# Patient Record
Sex: Male | Born: 1980 | Race: Black or African American | Hispanic: No | Marital: Married | Smoking: Current every day smoker
Health system: Southern US, Community
[De-identification: ages and names within clinical notes are randomized; demographics above are authoritative.]

## PROBLEM LIST (undated history)

## (undated) DIAGNOSIS — R51 Headache: Secondary | ICD-10-CM

---

## 2005-01-10 ENCOUNTER — Emergency Department (HOSPITAL_COMMUNITY): Admission: EM | Admit: 2005-01-10 | Discharge: 2005-01-10 | Payer: Self-pay | Admitting: Emergency Medicine

## 2005-01-14 ENCOUNTER — Emergency Department (HOSPITAL_COMMUNITY): Admission: EM | Admit: 2005-01-14 | Discharge: 2005-01-14 | Payer: Self-pay | Admitting: Emergency Medicine

## 2009-04-30 ENCOUNTER — Emergency Department (HOSPITAL_COMMUNITY): Admission: EM | Admit: 2009-04-30 | Discharge: 2009-04-30 | Payer: Self-pay | Admitting: Emergency Medicine

## 2010-02-02 ENCOUNTER — Emergency Department (HOSPITAL_COMMUNITY)
Admission: EM | Admit: 2010-02-02 | Discharge: 2010-02-02 | Payer: Self-pay | Source: Home / Self Care | Admitting: Emergency Medicine

## 2010-07-02 ENCOUNTER — Emergency Department (HOSPITAL_COMMUNITY)
Admission: EM | Admit: 2010-07-02 | Discharge: 2010-07-02 | Disposition: A | Discharge status: Home / Self Care | Payer: Self-pay | Attending: Emergency Medicine | Admitting: Emergency Medicine

## 2010-07-02 DIAGNOSIS — R112 Nausea with vomiting, unspecified: Secondary | ICD-10-CM | POA: Insufficient documentation

## 2010-07-02 DIAGNOSIS — R197 Diarrhea, unspecified: Secondary | ICD-10-CM | POA: Insufficient documentation

## 2010-07-02 DIAGNOSIS — E876 Hypokalemia: Secondary | ICD-10-CM | POA: Insufficient documentation

## 2010-07-02 LAB — BASIC METABOLIC PANEL
CO2: 25 mEq/L (ref 19–32)
Chloride: 105 mEq/L (ref 96–112)
Creatinine, Ser: 1.01 mg/dL (ref 0.4–1.5)
GFR calc Af Amer: >60 mL/min (ref >60)
Glucose, Bld: 102 mg/dL — ABNORMAL HIGH (ref 70–99)
Potassium: 3.1 mEq/L — ABNORMAL LOW (ref 3.5–5.1)
Sodium: 136 mEq/L (ref 135–145)

## 2010-07-02 LAB — DIFFERENTIAL
Basophils Absolute: 0 10*3/uL (ref 0.0–0.1)
Basophils Relative: 0 % (ref 0–1)
Eosinophils Relative: 1 % (ref 0–5)
Lymphocytes Relative: 19 % (ref 12–46)
Lymphs Abs: 1.2 10*3/uL (ref 0.7–4.0)
Monocytes Absolute: 0.6 10*3/uL (ref 0.1–1.0)
Monocytes Relative: 9 % (ref 3–12)
Neutro Abs: 4.6 10*3/uL (ref 1.7–7.7)
Neutrophils Relative %: 72 % (ref 43–77)

## 2010-07-02 LAB — URINALYSIS, ROUTINE W REFLEX MICROSCOPIC
Bilirubin Urine: NEGATIVE
Glucose, UA: NEGATIVE mg/dL
Hgb urine dipstick: NEGATIVE
Protein, ur: 30 mg/dL — AB
Specific Gravity, Urine: >1.03 — ABNORMAL HIGH (ref 1.005–1.030)
Urobilinogen, UA: 0.2 mg/dL (ref 0.0–1.0)
pH: 5.5 (ref 5.0–8.0)

## 2010-07-02 LAB — URINE MICROSCOPIC-ADD ON

## 2010-07-02 LAB — CBC
HCT: 44.8 % (ref 39.0–52.0)
Hemoglobin: 15.2 g/dL (ref 13.0–17.0)
MCH: 30.5 pg (ref 26.0–34.0)
MCHC: 33.9 g/dL (ref 30.0–36.0)
Platelets: 225 10*3/uL (ref 150–400)
RBC: 4.99 MIL/uL (ref 4.22–5.81)
RDW: 13 % (ref 11.5–15.5)

## 2010-12-19 ENCOUNTER — Encounter: Payer: Self-pay | Admitting: *Deleted

## 2010-12-19 ENCOUNTER — Emergency Department (HOSPITAL_COMMUNITY)
Admission: EM | Admit: 2010-12-19 | Discharge: 2010-12-19 | Disposition: A | Discharge status: Home / Self Care | Payer: Self-pay | Attending: Emergency Medicine | Admitting: Emergency Medicine

## 2010-12-19 DIAGNOSIS — R509 Fever, unspecified: Secondary | ICD-10-CM

## 2010-12-19 DIAGNOSIS — D72829 Elevated white blood cell count, unspecified: Secondary | ICD-10-CM

## 2010-12-19 DIAGNOSIS — K5289 Other specified noninfective gastroenteritis and colitis: Secondary | ICD-10-CM

## 2010-12-19 LAB — BASIC METABOLIC PANEL
BUN: 17 mg/dL (ref 6–23)
CO2: 25 mEq/L (ref 19–32)
Calcium: 9.5 mg/dL (ref 8.4–10.5)
Chloride: 102 mEq/L (ref 96–112)
GFR calc Af Amer: >60 mL/min (ref >60)
GFR calc non Af Amer: >60 mL/min (ref >60)
Potassium: 3.8 mEq/L (ref 3.5–5.1)
Sodium: 137 mEq/L (ref 135–145)

## 2010-12-19 LAB — CBC
HCT: 41.6 % (ref 39.0–52.0)
Hemoglobin: 14 g/dL (ref 13.0–17.0)
MCV: 89.3 fL (ref 78.0–100.0)
Platelets: 232 10*3/uL (ref 150–400)
RBC: 4.66 MIL/uL (ref 4.22–5.81)
RDW: 12.7 % (ref 11.5–15.5)
WBC: 14.4 10*3/uL — ABNORMAL HIGH (ref 4.0–10.5)

## 2010-12-19 LAB — DIFFERENTIAL
Basophils Absolute: 0 10*3/uL (ref 0.0–0.1)
Basophils Relative: 0 % (ref 0–1)
Eosinophils Absolute: 0 10*3/uL (ref 0.0–0.7)
Eosinophils Relative: 0 % (ref 0–5)
Lymphocytes Relative: 5 % — ABNORMAL LOW (ref 12–46)
Monocytes Relative: 5 % (ref 3–12)
Neutro Abs: 12.9 10*3/uL — ABNORMAL HIGH (ref 1.7–7.7)
Neutrophils Relative %: 90 % — ABNORMAL HIGH (ref 43–77)

## 2010-12-19 MED ORDER — ONDANSETRON HCL 4 MG/2ML IJ SOLN
4.0000 mg | Freq: Once | INTRAMUSCULAR | Status: AC
  Administered 2010-12-19: 4 mg via INTRAVENOUS
  Filled 2010-12-19: qty 2

## 2010-12-19 MED ORDER — PROMETHAZINE HCL 25 MG PO TABS: 25.0000 mg | ORAL_TABLET | Freq: Four times a day (QID) | ORAL | Status: AC | PRN

## 2010-12-19 MED ORDER — SODIUM CHLORIDE 0.9 % IV BOLUS (SEPSIS)
1000.0000 mL | Freq: Once | INTRAVENOUS | Status: AC
  Administered 2010-12-19: 1000 mL via INTRAVENOUS

## 2010-12-19 MED ORDER — ACETAMINOPHEN 325 MG PO TABS
650.0000 mg | ORAL_TABLET | Freq: Once | ORAL | Status: AC
  Administered 2010-12-19: 650 mg via ORAL
  Filled 2010-12-19: qty 2

## 2010-12-19 MED ORDER — LOPERAMIDE HCL 2 MG PO CAPS: 2.0000 mg | ORAL_CAPSULE | Freq: Four times a day (QID) | ORAL | Status: AC | PRN

## 2010-12-19 NOTE — ED Provider Notes (Addendum)
History    Antonio Pace is a 30 year old male with no past medical history he presents to the emergency department with a fever, nausea, vomiting, diarrhea, and abdominal pain since this morning. He denies blood in his stool or emesis. He took a BC powder and ibuprofen his temperature decreased to 103 to 101.  He denies abdominal pain now. He denies exposure to anyone with similar symptoms. He denies recent antibiotic use.    CSN: [id number] Arrival date & time: 12/19/2010 12:11 PM   Chief Complaint  Patient presents with  . Abdominal Pain     (Include location/radiation/quality/duration/timing/severity/associated sxs/prior treatment) The history is provided by the patient and the spouse.     History reviewed. No pertinent past medical history.   History reviewed. No pertinent past surgical history.  No family history on file.  History  Substance Use Topics  . Smoking status: Current Everyday Smoker  . Smokeless tobacco: Not on file  . Alcohol Use: No      Review of Systems  Constitutional: Positive for fever. Negative for diaphoresis.  HENT: Negative for neck pain.   Eyes: Negative for redness.  Respiratory: Negative for cough, chest tightness and shortness of breath.   Cardiovascular: Negative for chest pain and palpitations.  Gastrointestinal: Positive for nausea, vomiting, abdominal pain and diarrhea.       Abdominal pain, has resolved.  Musculoskeletal: Negative for back pain.  Neurological: Negative for headaches.  Psychiatric/Behavioral: Negative for confusion.    Allergies  Review of patient's allergies indicates not on file.  Home Medications   Current Outpatient Rx  Name Route Sig Dispense Refill  . ASPIRIN-CAFFEINE 845-65 MG PO PACK Oral Take 1 packet by mouth daily as needed. For pain     . BISMUTH SUBSALICYLATE 262 MG PO CHEW Oral Chew 524 mg by mouth as needed. Upset stomach     . IBUPROFEN 200 MG PO TABS Oral Take 200 mg by mouth every 6  (six) hours as needed. Fever, pain       Physical Exam    BP 105/64  Pulse 108  Temp(Src) 99.6 F (37.6 C) (Oral)  Resp 20  Ht 5\' 4"  (1.626 m)  Wt 130 lb (58.968 kg)  BMI 22.31 kg/m2  SpO2 100%  Physical Exam  Nursing note and vitals reviewed. Constitutional: He is oriented to person, place, and time. He appears well-developed and well-nourished. No distress.  HENT:  Head: Normocephalic and atraumatic.  Eyes: EOM are normal. Pupils are equal, round, and reactive to light.  Neck: Normal range of motion. Neck supple.  Cardiovascular: Regular rhythm and normal heart sounds.  Tachycardia present.   No murmur heard. Pulmonary/Chest: Effort normal and breath sounds normal. No respiratory distress. He has no wheezes. He has no rales.  Abdominal: Soft. Bowel sounds are normal. He exhibits no distension and no mass. There is no tenderness. There is no rebound and no guarding.  Musculoskeletal: Normal range of motion. He exhibits no edema and no tenderness.  Neurological: He is alert and oriented to person, place, and time. No cranial nerve deficit.  Skin: Skin is warm and dry. He is not diaphoretic.  Psychiatric: He has a normal mood and affect. His behavior is normal.    ED Course  Procedures  Results for orders placed during the hospital encounter of 12/19/10  CBC      Component Value Range   WBC 14.4 (*) 4.0 - 10.5 (K/uL)   RBC 4.66  4.22 - 5.81 (MIL/uL)  Hemoglobin 14.0  13.0 - 17.0 (g/dL)   HCT 16.1  09.6 - 04.5 (%)   MCV 89.3  78.0 - 100.0 (fL)   MCH 30.0  26.0 - 34.0 (pg)   MCHC 33.7  30.0 - 36.0 (g/dL)   RDW 40.9  81.1 - 91.4 (%)   Platelets 232  150 - 400 (K/uL)  DIFFERENTIAL      Component Value Range   Neutrophils Relative 90 (*) 43 - 77 (%)   Neutro Abs 12.9 (*) 1.7 - 7.7 (K/uL)   Lymphocytes Relative 5 (*) 12 - 46 (%)   Lymphs Abs 0.7  0.7 - 4.0 (K/uL)   Monocytes Relative 5  3 - 12 (%)   Monocytes Absolute 0.8  0.1 - 1.0 (K/uL)   Eosinophils Relative 0  0  - 5 (%)   Eosinophils Absolute 0.0  0.0 - 0.7 (K/uL)   Basophils Relative 0  0 - 1 (%)   Basophils Absolute 0.0  0.0 - 0.1 (K/uL)   Results for orders placed during the hospital encounter of 12/19/10  CBC      Component Value Range   WBC 14.4 (*) 4.0 - 10.5 (K/uL)   RBC 4.66  4.22 - 5.81 (MIL/uL)   Hemoglobin 14.0  13.0 - 17.0 (g/dL)   HCT 78.2  95.6 - 21.3 (%)   MCV 89.3  78.0 - 100.0 (fL)   MCH 30.0  26.0 - 34.0 (pg)   MCHC 33.7  30.0 - 36.0 (g/dL)   RDW 08.6  57.8 - 46.9 (%)   Platelets 232  150 - 400 (K/uL)  DIFFERENTIAL      Component Value Range   Neutrophils Relative 90 (*) 43 - 77 (%)   Neutro Abs 12.9 (*) 1.7 - 7.7 (K/uL)   Lymphocytes Relative 5 (*) 12 - 46 (%)   Lymphs Abs 0.7  0.7 - 4.0 (K/uL)   Monocytes Relative 5  3 - 12 (%)   Monocytes Absolute 0.8  0.1 - 1.0 (K/uL)   Eosinophils Relative 0  0 - 5 (%)   Eosinophils Absolute 0.0  0.0 - 0.7 (K/uL)   Basophils Relative 0  0 - 1 (%)   Basophils Absolute 0.0  0.0 - 0.1 (K/uL)  BASIC METABOLIC PANEL      Component Value Range   Sodium 137  135 - 145 (mEq/L)   Potassium 3.8  3.5 - 5.1 (mEq/L)   Chloride 102  96 - 112 (mEq/L)   CO2 25  19 - 32 (mEq/L)   Glucose, Bld 108 (*) 70 - 99 (mg/dL)   BUN 17  6 - 23 (mg/dL)   Creatinine, Ser 6.29  0.50 - 1.35 (mg/dL)   Calcium 9.5  8.4 - 52.8 (mg/dL)   GFR calc non Af Amer >60  >60 (mL/min)   GFR calc Af Amer >60  >60 (mL/min)     2:41 PM  He says he feels better.    MDM  Gastroenteritis with mild fever and leukocytosis. Antonio Pace feels better after treatment in the emergency department. He has no acute abdomen. He is nontoxic. He is young, healthy, male, and he wants to go home      Nicholes Stairs, MD 12/19/10 1448  Nicholes Stairs, MD 12/19/10 [phone]

## 2010-12-19 NOTE — ED Notes (Signed)
Abdominal pain with diarrhea and vomiting. Fever of 103 this morning. Took Motrin PTA. Vomited numerous times during the night. Vomited 3 x since 0700

## 2010-12-19 NOTE — ED Notes (Addendum)
C/o diarrhea x 2 days; reports onset of periumbilical pain and vomiting last night; reports onset of HA after vomiting. States last loose stool and last episode of vomiting just prior to leaving his house en route to ED.

## 2010-12-19 NOTE — ED Notes (Signed)
Left in c/o spouse for transport home; a&ox4; in no apparent distress.  Tolerating po fluids w/o c/o n/v.  No episodes of diarrhea while in ED.

## 2011-12-29 ENCOUNTER — Emergency Department (HOSPITAL_COMMUNITY)
Admission: EM | Admit: 2011-12-29 | Discharge: 2011-12-29 | Disposition: A | Discharge status: Home / Self Care | Payer: Self-pay | Attending: Emergency Medicine | Admitting: Emergency Medicine

## 2011-12-29 ENCOUNTER — Encounter (HOSPITAL_COMMUNITY): Payer: Self-pay | Admitting: Emergency Medicine

## 2011-12-29 ENCOUNTER — Emergency Department (HOSPITAL_COMMUNITY): Payer: Self-pay

## 2011-12-29 DIAGNOSIS — F17200 Nicotine dependence, unspecified, uncomplicated: Secondary | ICD-10-CM | POA: Insufficient documentation

## 2011-12-29 DIAGNOSIS — R05 Cough: Secondary | ICD-10-CM | POA: Insufficient documentation

## 2011-12-29 DIAGNOSIS — R059 Cough, unspecified: Secondary | ICD-10-CM | POA: Insufficient documentation

## 2011-12-29 DIAGNOSIS — Z8673 Personal history of transient ischemic attack (TIA), and cerebral infarction without residual deficits: Secondary | ICD-10-CM | POA: Insufficient documentation

## 2011-12-29 DIAGNOSIS — J40 Bronchitis, not specified as acute or chronic: Secondary | ICD-10-CM

## 2011-12-29 MED ORDER — AMOXICILLIN 500 MG PO CAPS: 500.0000 mg | ORAL_CAPSULE | Freq: Three times a day (TID) | ORAL | Status: DC

## 2011-12-29 NOTE — ED Provider Notes (Signed)
History   This chart was scribed for [name], MD by Gerlean Ren. This patient was seen in room [facility] and the patient's care was started at 7:58AM.   CSN: [id number]  Arrival date & time 12/29/11  4098   First MD Initiated Contact with Patient 12/29/11 [phone]      Chief Complaint  Patient presents with  . Cough    (Consider location/radiation/quality/duration/timing/severity/associated sxs/prior treatment) Patient is a 31 y.o. male presenting with cough. The history is provided by the patient. No language interpreter was used.  Cough This is a new problem. The current episode started 2 days ago. The problem has been gradually worsening. The cough is productive of bloody sputum. There has been no fever. The fever has been present for 1 to 2 days. Associated symptoms include chest pain and sore throat. Pertinent negatives include no headaches. He has tried nothing for the symptoms. He is a smoker. His past medical history does not include pneumonia.   Antonio Pace is a 31 y.o. male who presents to the Emergency Department complaining of 2 days of constant, gradually worsening cough producing bloody sputum.  Pt reports associated fever, sore throat, and chest pain with deep breathing.  Pt has a h/o CVA.  Pt is a current everyday smoker but denies alcohol use.    Past Medical History  Diagnosis Date  . Stroke     History reviewed. No pertinent past surgical history.  History reviewed. No pertinent family history.  History  Substance Use Topics  . Smoking status: Current Every Day Smoker  . Smokeless tobacco: Not on file  . Alcohol Use: No      Review of Systems  Constitutional: Positive for fever.  HENT: Positive for sore throat. Negative for congestion, sinus pressure and ear discharge.   Eyes: Negative for discharge.  Respiratory: Positive for cough.   Cardiovascular: Positive for chest pain.  Gastrointestinal: Negative for abdominal pain and diarrhea.    Genitourinary: Negative for frequency and hematuria.  Musculoskeletal: Negative for back pain.  Skin: Negative for rash.  Neurological: Negative for seizures and headaches.  Hematological: Negative.   Psychiatric/Behavioral: Negative for hallucinations.    Allergies  Review of patient's allergies indicates no known allergies.  Home Medications   Current Outpatient Rx  Name Route Sig Dispense Refill  . ASPIRIN-CAFFEINE 845-65 MG PO PACK Oral Take 1 packet by mouth daily as needed. For pain     . BISMUTH SUBSALICYLATE 262 MG PO CHEW Oral Chew 524 mg by mouth as needed. Upset stomach     . IBUPROFEN 200 MG PO TABS Oral Take 200 mg by mouth every 6 (six) hours as needed. Fever, pain       BP 116/77  Pulse 79  Temp 98.8 F (37.1 C) (Oral)  Resp 15  Ht 5\' 4"  (1.626 m)  Wt 140 lb (63.504 kg)  BMI 24.03 kg/m2  SpO2 100%  Physical Exam  Nursing note and vitals reviewed. Constitutional: He is oriented to person, place, and time. He appears well-developed.  HENT:  Head: Normocephalic and atraumatic.  Mouth/Throat: Oropharynx is clear and moist.  Eyes: Conjunctivae normal and EOM are normal. No scleral icterus.  Neck: Neck supple. No thyromegaly present.  Cardiovascular: Normal rate and regular rhythm.  Exam reveals no gallop and no friction rub.   No murmur heard. Pulmonary/Chest: No stridor. He has no wheezes. He has no rales. He exhibits no tenderness.  Abdominal: He exhibits no distension. There is no tenderness.  There is no rebound.  Musculoskeletal: Normal range of motion. He exhibits no edema.  Lymphadenopathy:    He has no cervical adenopathy.  Neurological: He is oriented to person, place, and time. Coordination normal.  Skin: No rash noted. No erythema.  Psychiatric: He has a normal mood and affect. His behavior is normal.    ED Course  Procedures (including critical care time) DIAGNOSTIC STUDIES: Oxygen Saturation is 100% on room air, normal by my interpretation.     COORDINATION OF CARE: 8:00AM- Ordered chest XR.     Labs Reviewed - No data to display No results found.  Dg Chest 2 View  12/29/2011  *RADIOLOGY REPORT*  Clinical Data: Chest pain, shortness of breath, hemoptysis  CHEST - 2 VIEW  Comparison: None  Findings: Normal heart size, mediastinal contours, and pulmonary vascularity. Lungs clear. No pleural effusion or pneumothorax. Bones unremarkable.  IMPRESSION: Normal exam.   Original Report Authenticated By: Lollie Marrow, M.D.     No diagnosis found.    MDM   The chart was scribed for me under my direct supervision.  I personally performed the history, physical, and medical decision making and all procedures in the evaluation of this patient.[name], MD 12/29/11 [phone]

## 2011-12-29 NOTE — ED Notes (Signed)
Pt c/o cough/sore throat/chest pain when coughing. Pt c.o green, blood tinged mucus coughed up last 2 days.

## 2012-08-18 ENCOUNTER — Emergency Department (HOSPITAL_COMMUNITY)
Admission: EM | Admit: 2012-08-18 | Discharge: 2012-08-18 | Disposition: A | Discharge status: Home / Self Care | Payer: Self-pay | Attending: Emergency Medicine | Admitting: Emergency Medicine

## 2012-08-18 ENCOUNTER — Encounter (HOSPITAL_COMMUNITY): Payer: Self-pay

## 2012-08-18 DIAGNOSIS — Z8673 Personal history of transient ischemic attack (TIA), and cerebral infarction without residual deficits: Secondary | ICD-10-CM | POA: Insufficient documentation

## 2012-08-18 DIAGNOSIS — F17200 Nicotine dependence, unspecified, uncomplicated: Secondary | ICD-10-CM | POA: Insufficient documentation

## 2012-08-18 DIAGNOSIS — R5381 Other malaise: Secondary | ICD-10-CM | POA: Insufficient documentation

## 2012-08-18 DIAGNOSIS — R531 Weakness: Secondary | ICD-10-CM

## 2012-08-18 DIAGNOSIS — Z8669 Personal history of other diseases of the nervous system and sense organs: Secondary | ICD-10-CM | POA: Insufficient documentation

## 2012-08-18 DIAGNOSIS — R112 Nausea with vomiting, unspecified: Secondary | ICD-10-CM | POA: Insufficient documentation

## 2012-08-18 HISTORY — DX: Headache: R51

## 2012-08-18 LAB — COMPREHENSIVE METABOLIC PANEL
ALT: 14 U/L (ref 0–53)
AST: 18 U/L (ref 0–37)
Albumin: 4.2 g/dL (ref 3.5–5.2)
Alkaline Phosphatase: 57 U/L (ref 39–117)
BUN: 17 mg/dL (ref 6–23)
CO2: 24 mEq/L (ref 19–32)
Calcium: 10.1 mg/dL (ref 8.4–10.5)
Creatinine, Ser: 0.98 mg/dL (ref 0.50–1.35)
GFR calc Af Amer: >90 mL/min (ref >90)
GFR calc non Af Amer: >90 mL/min (ref >90)
Glucose, Bld: 98 mg/dL (ref 70–99)
Potassium: 3.6 mEq/L (ref 3.5–5.1)
Sodium: 134 mEq/L — ABNORMAL LOW (ref 135–145)
Total Bilirubin: 0.3 mg/dL (ref 0.3–1.2)

## 2012-08-18 LAB — URINALYSIS, ROUTINE W REFLEX MICROSCOPIC
Bilirubin Urine: NEGATIVE
Hgb urine dipstick: NEGATIVE
Ketones, ur: 15 mg/dL — AB
Specific Gravity, Urine: 1.02 (ref 1.005–1.030)
Urobilinogen, UA: 2 mg/dL — ABNORMAL HIGH (ref 0.0–1.0)
pH: 7 (ref 5.0–8.0)

## 2012-08-18 LAB — CBC WITH DIFFERENTIAL/PLATELET
Basophils Relative: 0 % (ref 0–1)
Eosinophils Absolute: 0 10*3/uL (ref 0.0–0.7)
Eosinophils Relative: 1 % (ref 0–5)
Hemoglobin: 15.6 g/dL (ref 13.0–17.0)
Lymphocytes Relative: 39 % (ref 12–46)
Lymphs Abs: 2.2 10*3/uL (ref 0.7–4.0)
MCH: 30.4 pg (ref 26.0–34.0)
MCHC: 35.5 g/dL (ref 30.0–36.0)
MCV: 85.8 fL (ref 78.0–100.0)
Monocytes Relative: 9 % (ref 3–12)
Neutro Abs: 2.9 10*3/uL (ref 1.7–7.7)
Neutrophils Relative %: 51 % (ref 43–77)
Platelets: 303 10*3/uL (ref 150–400)
RBC: 5.13 MIL/uL (ref 4.22–5.81)
RDW: 13 % (ref 11.5–15.5)
WBC: 5.7 10*3/uL (ref 4.0–10.5)

## 2012-08-18 LAB — URINE MICROSCOPIC-ADD ON

## 2012-08-18 MED ORDER — ONDANSETRON HCL 4 MG/2ML IJ SOLN: 4.0000 mg | INTRAMUSCULAR | Status: DC | PRN

## 2012-08-18 MED ORDER — SODIUM CHLORIDE 0.9 % IV BOLUS (SEPSIS)
1000.0000 mL | Freq: Once | INTRAVENOUS | Status: AC
  Administered 2012-08-18: 1000 mL via INTRAVENOUS

## 2012-08-18 MED ORDER — SODIUM CHLORIDE 0.9 % IV SOLN
INTRAVENOUS | Status: DC
  Administered 2012-08-18: 11:00:00 via INTRAVENOUS

## 2012-08-18 MED ORDER — ONDANSETRON HCL 4 MG PO TABS: 4.0000 mg | ORAL_TABLET | Freq: Three times a day (TID) | ORAL | Status: AC | PRN

## 2012-08-18 NOTE — ED Notes (Signed)
Pt reports being very weak since yesterday, vomiting at times. No fever or diarrhea.

## 2012-08-18 NOTE — ED Notes (Signed)
Pt able to keep po fluids down. NAD. States he is feeling a little better.

## 2012-08-18 NOTE — ED Provider Notes (Signed)
History     CSN: [id number]  Arrival date & time 08/18/12  5784   First MD Initiated Contact with Patient 08/18/12 1031      Chief Complaint  Patient presents with  . Weakness  . Emesis     HPI Pt was seen at 1040.  Per pt, c/o gradual onset and persistence of constant generalized weakness and fatigue that began yesterday after he "was working in a [employer]."  States he had one episode of N/V last night.  States he tried to drink extra fluid last night, but still feels "weak all over" today.  Denies CP/palpitations, no SOB/cough, no abd pain, no diarrhea, no fevers, no black or blood in stools or emesis.     Past Medical History  Diagnosis Date  . Stroke   . Headache     History reviewed. No pertinent past surgical history.   History  Substance Use Topics  . Smoking status: Current Every Day Smoker  . Smokeless tobacco: Not on file  . Alcohol Use: No      Review of Systems ROS: Statement: All systems negative except as marked or noted in the HPI; Constitutional: +generalized weakness. Negative for fever and chills. ; ; Eyes: Negative for eye pain, redness and discharge. ; ; ENMT: Negative for ear pain, hoarseness, nasal congestion, sinus pressure and sore throat. ; ; Cardiovascular: Negative for chest pain, palpitations, diaphoresis, dyspnea and peripheral edema. ; ; Respiratory: Negative for cough, wheezing and stridor. ; ; Gastrointestinal: +N/V. Negative for diarrhea, abdominal pain, blood in stool, hematemesis, jaundice and rectal bleeding. . ; ; Genitourinary: Negative for dysuria, flank pain and hematuria. ; ; Musculoskeletal: Negative for back pain and neck pain. Negative for swelling and trauma.; ; Skin: Negative for pruritus, rash, abrasions, blisters, bruising and skin lesion.; ; Neuro: Negative for headache, lightheadedness and neck stiffness. Negative for altered level of consciousness , altered mental status, extremity weakness, paresthesias, involuntary  movement, seizure and syncope.       Allergies  Ultram  Home Medications  No current outpatient prescriptions on file.  BP 120/88  Pulse 70  Temp(Src) 99.1 F (37.3 C) (Oral)  Resp 20  Ht 5\' 4"  (1.626 m)  Wt 140 lb (63.504 kg)  BMI 24.02 kg/m2  SpO2 98%  Physical Exam 1045: Physical examination:  Nursing notes reviewed; Vital signs and O2 SAT reviewed;  Constitutional: Well developed, Well nourished, Well hydrated, In no acute distress; Head:  Normocephalic, atraumatic; Eyes: EOMI, PERRL, No scleral icterus; ENMT: Mouth and pharynx normal, Mucous membranes moist; Neck: Supple, Full range of motion, No lymphadenopathy; Cardiovascular: Regular rate and rhythm, No murmur, rub, or gallop; Respiratory: Breath sounds clear & equal bilaterally, No rales, rhonchi, wheezes.  Speaking full sentences with ease, Normal respiratory effort/excursion; Chest: Nontender, Movement normal; Abdomen: Soft, Nontender, Nondistended, Normal bowel sounds; Genitourinary: No CVA tenderness; Extremities: Pulses normal, No tenderness, No edema, No calf edema or asymmetry.; Neuro: AA&Ox3, Major CN grossly intact.  Speech clear. No gross focal motor or sensory deficits in extremities.; Skin: Color normal, Warm, Dry.   ED Course  Procedures    MDM  MDM Reviewed: nursing note and vitals Interpretation: labs   Results for orders placed during the hospital encounter of 08/18/12  CBC WITH DIFFERENTIAL      Result Value Range   WBC 5.7  4.0 - 10.5 K/uL   RBC 5.13  4.22 - 5.81 MIL/uL   Hemoglobin 15.6  13.0 - 17.0 g/dL   HCT 44.0  39.0 - 52.0 %   MCV 85.8  78.0 - 100.0 fL   MCH 30.4  26.0 - 34.0 pg   MCHC 35.5  30.0 - 36.0 g/dL   RDW 16.1  09.6 - 04.5 %   Platelets 303  150 - 400 K/uL   Neutrophils Relative % 51  43 - 77 %   Neutro Abs 2.9  1.7 - 7.7 K/uL   Lymphocytes Relative 39  12 - 46 %   Lymphs Abs 2.2  0.7 - 4.0 K/uL   Monocytes Relative 9  3 - 12 %   Monocytes Absolute 0.5  0.1 - 1.0 K/uL    Eosinophils Relative 1  0 - 5 %   Eosinophils Absolute 0.0  0.0 - 0.7 K/uL   Basophils Relative 0  0 - 1 %   Basophils Absolute 0.0  0.0 - 0.1 K/uL  COMPREHENSIVE METABOLIC PANEL      Result Value Range   Sodium 134 (*) 135 - 145 mEq/L   Potassium 3.6  3.5 - 5.1 mEq/L   Chloride 98  96 - 112 mEq/L   CO2 24  19 - 32 mEq/L   Glucose, Bld 98  70 - 99 mg/dL   BUN 17  6 - 23 mg/dL   Creatinine, Ser 4.09  0.50 - 1.35 mg/dL   Calcium 81.1  8.4 - 91.4 mg/dL   Total Protein 7.5  6.0 - 8.3 g/dL   Albumin 4.2  3.5 - 5.2 g/dL   AST 18  0 - 37 U/L   ALT 14  0 - 53 U/L   Alkaline Phosphatase 57  39 - 117 U/L   Total Bilirubin 0.3  0.3 - 1.2 mg/dL   GFR calc non Af Amer >90  >90 mL/min   GFR calc Af Amer >90  >90 mL/min  LIPASE, BLOOD      Result Value Range   Lipase 38  11 - 59 U/L  URINALYSIS, ROUTINE W REFLEX MICROSCOPIC      Result Value Range   Color, Urine YELLOW  YELLOW   APPearance CLEAR  CLEAR   Specific Gravity, Urine 1.020  1.005 - 1.030   pH 7.0  5.0 - 8.0   Glucose, UA NEGATIVE  NEGATIVE mg/dL   Hgb urine dipstick NEGATIVE  NEGATIVE   Bilirubin Urine NEGATIVE  NEGATIVE   Ketones, ur 15 (*) NEGATIVE mg/dL   Protein, ur TRACE (*) NEGATIVE mg/dL   Urobilinogen, UA 2.0 (*) 0.0 - 1.0 mg/dL   Nitrite NEGATIVE  NEGATIVE   Leukocytes, UA NEGATIVE  NEGATIVE  URINE MICROSCOPIC-ADD ON      Result Value Range   RBC / HPF 0-2  <3 RBC/hpf   Bacteria, UA MANY (*) RARE     1315:  Pt has tol PO well while in the ED without N/V.  No stooling while in the ED.  Abd remains benign, VSS. Not orthostatic. Wants to go home now. Dx and testing d/w pt and family.  Questions answered.  Verb understanding, agreeable to d/c home with outpt f/u.              Laray Anger, DO 08/21/12 1054

## 2012-08-18 NOTE — ED Notes (Signed)
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 States he got to hot yesterday. Vomited once last night. States he still feels weak today

## 2012-08-18 NOTE — ED Notes (Signed)
Pt states he vomited x 1 last night and states he is weak today, states he felt like he was going to pass out at work. States nausea this morning, stating he stood up too fast. NAD. Pt does not report any pain or any other symptoms at this time.

## 2012-08-18 NOTE — ED Notes (Signed)
PO fluid challenge initiated 

## 2012-08-19 LAB — URINE CULTURE: Culture: NO GROWTH

## 2013-10-24 ENCOUNTER — Emergency Department (HOSPITAL_COMMUNITY): Payer: Self-pay

## 2013-10-24 ENCOUNTER — Emergency Department (HOSPITAL_COMMUNITY)
Admission: EM | Admit: 2013-10-24 | Discharge: 2013-10-24 | Disposition: A | Discharge status: Home / Self Care | Payer: Self-pay | Attending: Emergency Medicine | Admitting: Emergency Medicine

## 2013-10-24 ENCOUNTER — Encounter (HOSPITAL_COMMUNITY): Payer: Self-pay | Admitting: Emergency Medicine

## 2013-10-24 DIAGNOSIS — Z791 Long term (current) use of non-steroidal anti-inflammatories (NSAID): Secondary | ICD-10-CM | POA: Insufficient documentation

## 2013-10-24 DIAGNOSIS — M545 Low back pain, unspecified: Secondary | ICD-10-CM

## 2013-10-24 DIAGNOSIS — X58XXXA Exposure to other specified factors, initial encounter: Secondary | ICD-10-CM | POA: Insufficient documentation

## 2013-10-24 DIAGNOSIS — Z8673 Personal history of transient ischemic attack (TIA), and cerebral infarction without residual deficits: Secondary | ICD-10-CM | POA: Insufficient documentation

## 2013-10-24 DIAGNOSIS — F17200 Nicotine dependence, unspecified, uncomplicated: Secondary | ICD-10-CM | POA: Insufficient documentation

## 2013-10-24 DIAGNOSIS — S335XXA Sprain of ligaments of lumbar spine, initial encounter: Secondary | ICD-10-CM | POA: Insufficient documentation

## 2013-10-24 DIAGNOSIS — S39012A Strain of muscle, fascia and tendon of lower back, initial encounter: Secondary | ICD-10-CM

## 2013-10-24 DIAGNOSIS — Y9289 Other specified places as the place of occurrence of the external cause: Secondary | ICD-10-CM | POA: Insufficient documentation

## 2013-10-24 DIAGNOSIS — Z79899 Other long term (current) drug therapy: Secondary | ICD-10-CM | POA: Insufficient documentation

## 2013-10-24 DIAGNOSIS — Y9311 Activity, swimming: Secondary | ICD-10-CM | POA: Insufficient documentation

## 2013-10-24 MED ORDER — IBUPROFEN 800 MG PO TABS
800.0000 mg | ORAL_TABLET | Freq: Once | ORAL | Status: AC
  Administered 2013-10-24: 800 mg via ORAL
  Filled 2013-10-24: qty 1

## 2013-10-24 MED ORDER — NAPROXEN 500 MG PO TABS: 500.0000 mg | ORAL_TABLET | Freq: Two times a day (BID) | ORAL | Status: DC

## 2013-10-24 MED ORDER — OXYCODONE-ACETAMINOPHEN 5-325 MG PO TABS
2.0000 | ORAL_TABLET | Freq: Once | ORAL | Status: AC
  Administered 2013-10-24: 2 via ORAL
  Filled 2013-10-24: qty 2

## 2013-10-24 MED ORDER — DIAZEPAM 5 MG PO TABS
5.0000 mg | ORAL_TABLET | Freq: Once | ORAL | Status: AC
  Administered 2013-10-24: 5 mg via ORAL
  Filled 2013-10-24: qty 1

## 2013-10-24 MED ORDER — METHOCARBAMOL 500 MG PO TABS
500.0000 mg | ORAL_TABLET | Freq: Two times a day (BID) | ORAL | Status: AC
Start: 2013-10-24 — End: ?

## 2013-10-24 MED ORDER — HYDROCODONE-ACETAMINOPHEN 5-325 MG PO TABS: 2.0000 | ORAL_TABLET | ORAL | Status: DC | PRN

## 2013-10-24 NOTE — ED Provider Notes (Signed)
CSN: [id number]     Arrival date & time 10/24/13  [id number] History   First MD Initiated Contact with Patient 10/24/13 0645     Chief Complaint  Patient presents with  . Back Pain      HPI  Patient presents with back pain. States she's had some intermittent discomfort in his back for a few years. There placed and seen by a physician for it. 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 However he is on his feet most of the time. Yesterday, he jumped off a diving board. As he was swimming to the side he felt pain in his back. Is uncertain if the pain started on the board, or upon landing in the  water. However he states he landed flat on his stomach. THe had pain through the night. States that  he can "barely get out of bed" this morning. His pain is low back and radiates upward. No lower extremity symptoms. No weakness. No incontinence.  Past Medical History  Diagnosis Date  . Stroke   . Headache(784.0)    History reviewed. No pertinent past surgical history. No family history on file. History  Substance Use Topics  . Smoking status: Current Every Day Smoker -- 0.50 packs/day    Types: Cigarettes  . Smokeless tobacco: Not on file  . Alcohol Use: Yes     Comment: occasionally    Review of Systems  Constitutional: Negative for fever, chills, diaphoresis, appetite change and fatigue.  HENT: Negative for mouth sores, sore throat and trouble swallowing.   Eyes: Negative for visual disturbance.  Respiratory: Negative for cough, chest tightness, shortness of breath and wheezing.   Cardiovascular: Negative for chest pain.  Gastrointestinal: Negative for nausea, vomiting, abdominal pain, diarrhea and abdominal distention.  Endocrine: Negative for polydipsia, polyphagia and polyuria.  Genitourinary: Negative for dysuria, frequency and hematuria.  Musculoskeletal: Positive for back pain. Negative for gait problem.  Skin: Negative for color change, pallor and rash.    Neurological: Negative for dizziness, syncope, light-headedness and headaches.  Hematological: Does not bruise/bleed easily.  Psychiatric/Behavioral: Negative for behavioral problems and confusion.      Allergies  Ultram  Home Medications   Prior to Admission medications   Medication Sig Start Date End Date Taking? Authorizing Provider  HYDROcodone-acetaminophen (NORCO/VICODIN) 5-325 MG per tablet Take 2 tablets by mouth every 4 (four) hours as needed. 10/24/13   Rolland PorterMark Gearline Spilman, MD  methocarbamol (ROBAXIN) 500 MG tablet Take 1 tablet (500 mg total) by mouth 2 (two) times daily. 10/24/13   Rolland PorterMark Donyae Kilner, MD  naproxen (NAPROSYN) 500 MG tablet Take 1 tablet (500 mg total) by mouth 2 (two) times daily. 10/24/13   Rolland PorterMark Marlys Stegmaier, MD  ondansetron (ZOFRAN) 4 MG tablet Take 1 tablet (4 mg total) by mouth every 8 (eight) hours as needed for nausea. 08/18/12   Laray AngerKathleen M McManus, DO   BP 115/83  Pulse 77  Temp(Src) 98.2 F (36.8 C) (Oral)  Resp 16  Ht 5\' 4"  (1.626 m)  Wt 140 lb (63.504 kg)  BMI 24.02 kg/m2  SpO2 100% Physical Exam  Constitutional: He is oriented to person, place, and time. He appears well-developed and well-nourished. No distress.  Thin adult male. Laying right lateral decubitus.  HENT:  Head: Normocephalic.  Eyes: Conjunctivae are normal. Pupils are equal, round, and reactive to light. No scleral icterus.  Neck: Normal range of motion. Neck supple. No thyromegaly present.  Cardiovascular: Normal rate and regular rhythm.  Exam reveals  no gallop and no friction rub.   No murmur heard. Pulmonary/Chest: Effort normal and breath sounds normal. No respiratory distress. He has no wheezes. He has no rales.  Abdominal: Soft. Bowel sounds are normal. He exhibits no distension. There is no tenderness. There is no rebound.  Musculoskeletal: Normal range of motion.       Back:  Neurological: He is alert and oriented to person, place, and time.  Normal symmetric Strength to shoulder shrug,  triceps, biceps, grip,wrist flex/extend,and intrinsics  Norma lsymmetric sensation above and below clavicles, and to all distributions to UEs. Norma symmetric strength to flex/.extend hip and knees, dorsi/plantar flex ankles. Normal symmetric sensation to all distributions to LEs Patellar and achilles reflexes 1-2+. Downgoing Babinski    Skin: Skin is warm and dry. No rash noted.  Psychiatric: He has a normal mood and affect. His behavior is normal.    ED Course  Procedures (including critical care time) Labs Review Labs Reviewed - No data to display  Imaging Review Dg Lumbar Spine Complete  10/24/2013   CLINICAL DATA:  Low back pain status post injury 1 day ago  EXAM: LUMBAR SPINE - COMPLETE 4+ VIEW  COMPARISON:  AP view of the abdomen of December 21, 2010.  FINDINGS: The lumbar vertebral bodies are preserved in height. The disc space heights are well maintained. There is no spondylolisthesis. The facet joints are unremarkable. The pedicles and transverse processes are intact where visualized. The observed portions of the sacrum are normal.  IMPRESSION: No acute bony lumbar spine abnormality.   Electronically Signed   By: David  [place]   On: 10/24/2013 07:49     EKG Interpretation None      MDM   Final diagnoses:  Midline low back pain without sciatica  Lumbar strain, initial encounter    No compression fractures on x-rays. Plan will be home. Rest, supine her side laying. Return to the emergency room with any progression of symptoms. Return precautions discussed with patient at length. Primary care or orthopedic followup if not improving. 48 hours rest, slowly increasing activity.    Rolland Porter, MD 10/24/13 [phone]

## 2013-10-24 NOTE — ED Notes (Signed)
Pt. Reports chronic back pain that became worse yesterday after jumping off diving board. Pt. Reports difficulty walking and standing.

## 2013-10-24 NOTE — Discharge Instructions (Signed)
No work for the next 48-72 hours. Rest, on your back, or on your side with your knees bent. Return to the emergency room with any weakness or numbness of the legs, or difficulty controlling your bowel or bladder.  Back Pain, Adult Back pain is very common. The pain often gets better over time. The cause of back pain is usually not dangerous. Most people can learn to manage their back pain on their own.  HOME CARE   Stay active. Start with short walks on flat ground if you can. Try to walk farther each day.  Do not sit, drive, or stand in one place for more than 30 minutes. Do not stay in bed.  Do not avoid exercise or work. Activity can help your back heal faster.  Be careful when you bend or lift an object. Bend at your knees, keep the object close to you, and do not twist.  Sleep on a firm mattress. Lie on your side, and bend your knees. If you lie on your back, put a pillow under your knees.  Only take medicines as told by your doctor.  Put ice on the injured area.  Put ice in a plastic bag.  Place a towel between your skin and the bag.  Leave the ice on for 15-20 minutes, 03-04 times a day for the first 2 to 3 days. After that, you can switch between ice and heat packs.  Ask your doctor about back exercises or massage.  Avoid feeling anxious or stressed. Find good ways to deal with stress, such as exercise. GET HELP RIGHT AWAY IF:   Your pain does not go away with rest or medicine.  Your pain does not go away in 1 week.  You have new problems.  You do not feel well.  The pain spreads into your legs.  You cannot control when you poop (bowel movement) or pee (urinate).  Your arms or legs feel weak or lose feeling (numbness).  You feel sick to your stomach (nauseous) or throw up (vomit).  You have belly (abdominal) pain.  You feel like you may pass out (faint). MAKE SURE YOU:   Understand these instructions.  Will watch your condition.  Will get help right  away if you are not doing well or get worse. Document Released: 09/10/2007 Document Revised: 06/16/2011 Document Reviewed: 08/12/2010 [facility] Patient Information 2015 [place], [place]. This information is not intended to replace advice given to you by your health care provider. Make sure you discuss any questions you have with your health care provider.  Lumbosacral Strain Lumbosacral strain is a strain of any of the parts that make up your lumbosacral vertebrae. Your lumbosacral vertebrae are the bones that make up the lower third of your backbone. Your lumbosacral vertebrae are held together by muscles and tough, fibrous tissue (ligaments).  CAUSES  A sudden blow to your back can cause lumbosacral strain. Also, anything that causes an excessive stretch of the muscles in the low back can cause this strain. This is typically seen when people exert themselves strenuously, fall, lift heavy objects, bend, or crouch repeatedly. RISK FACTORS  Physically demanding work.  Participation in pushing or pulling sports or sports that require a sudden twist of the back (tennis, golf, baseball).  Weight lifting.  Excessive lower back curvature.  Forward-tilted pelvis.  Weak back or abdominal muscles or both.  Tight hamstrings. SIGNS AND SYMPTOMS  Lumbosacral strain may cause pain in the area of your injury or pain that moves (  radiates) down your leg.  DIAGNOSIS Your health care provider can often diagnose lumbosacral strain through a physical exam. In some cases, you may need tests such as X-ray exams.  TREATMENT  Treatment for your lower back injury depends on many factors that your clinician will have to evaluate. However, most treatment will include the use of anti-inflammatory medicines. HOME CARE INSTRUCTIONS   Avoid hard physical activities (tennis, racquetball, waterskiing) if you are not in proper physical condition for it. This may aggravate or create problems.  If you have a back  problem, avoid sports requiring sudden body movements. Swimming and walking are generally safer activities.  Maintain good posture.  Maintain a healthy weight.  For acute conditions, you may put ice on the injured area.  Put ice in a plastic bag.  Place a towel between your skin and the bag.  Leave the ice on for 20 minutes, 2-3 times a day.  When the low back starts healing, stretching and strengthening exercises may be recommended. SEEK MEDICAL CARE IF:  Your back pain is getting worse.  You experience severe back pain not relieved with medicines. SEEK IMMEDIATE MEDICAL CARE IF:   You have numbness, tingling, weakness, or problems with the use of your arms or legs.  There is a change in bowel or bladder control.  You have increasing pain in any area of the body, including your belly (abdomen).  You notice shortness of breath, dizziness, or feel faint.  You feel sick to your stomach (nauseous), are throwing up (vomiting), or become sweaty.  You notice discoloration of your toes or legs, or your feet get very cold. MAKE SURE YOU:   Understand these instructions.  Will watch your condition.  Will get help right away if you are not doing well or get worse. Document Released: 01/01/2005 Document Revised: 03/29/2013 Document Reviewed: 11/10/2012 [facility] Patient Information 2015 [place]. This information is not intended to replace advice given to you by your health care provider. Make sure you discuss any questions you have with your health care provider.

## 2014-08-14 ENCOUNTER — Emergency Department (HOSPITAL_COMMUNITY): Payer: BLUE CROSS/BLUE SHIELD

## 2014-08-14 ENCOUNTER — Emergency Department (HOSPITAL_COMMUNITY)
Admission: EM | Admit: 2014-08-14 | Discharge: 2014-08-14 | Disposition: A | Discharge status: Home / Self Care | Payer: BLUE CROSS/BLUE SHIELD | Attending: Emergency Medicine | Admitting: Emergency Medicine

## 2014-08-14 ENCOUNTER — Encounter (HOSPITAL_COMMUNITY): Payer: Self-pay | Admitting: Cardiology

## 2014-08-14 DIAGNOSIS — J40 Bronchitis, not specified as acute or chronic: Secondary | ICD-10-CM

## 2014-08-14 DIAGNOSIS — Z720 Tobacco use: Secondary | ICD-10-CM | POA: Diagnosis not present

## 2014-08-14 DIAGNOSIS — Z79899 Other long term (current) drug therapy: Secondary | ICD-10-CM | POA: Insufficient documentation

## 2014-08-14 DIAGNOSIS — R05 Cough: Secondary | ICD-10-CM | POA: Diagnosis present

## 2014-08-14 DIAGNOSIS — Z791 Long term (current) use of non-steroidal anti-inflammatories (NSAID): Secondary | ICD-10-CM | POA: Insufficient documentation

## 2014-08-14 DIAGNOSIS — J209 Acute bronchitis, unspecified: Secondary | ICD-10-CM | POA: Diagnosis not present

## 2014-08-14 MED ORDER — IBUPROFEN 800 MG PO TABS
800.0000 mg | ORAL_TABLET | Freq: Once | ORAL | Status: AC
  Administered 2014-08-14: 800 mg via ORAL
  Filled 2014-08-14: qty 1

## 2014-08-14 MED ORDER — AZITHROMYCIN 250 MG PO TABS: ORAL_TABLET | ORAL | Status: DC

## 2014-08-14 MED ORDER — IBUPROFEN 800 MG PO TABS: 800.0000 mg | ORAL_TABLET | Freq: Three times a day (TID) | ORAL | Status: DC | PRN

## 2014-08-14 NOTE — Discharge Instructions (Signed)
Drink plenty of fluids and follow up if not improving. °

## 2014-08-14 NOTE — ED Provider Notes (Signed)
CSN: [id number]     Arrival date & time 08/14/14  [id number] History   First MD Initiated Contact with Patient 08/14/14 [phone]     Chief Complaint  Patient presents with  . Influenza     (Consider location/radiation/quality/duration/timing/severity/associated sxs/prior Treatment) Patient is a 34 y.o. male presenting with flu symptoms. The history is provided by the patient (pt complains of a cough and fever).  Influenza Presenting symptoms: cough   Presenting symptoms: no diarrhea, no fatigue and no headaches   Severity:  Moderate Onset quality:  Gradual Progression:  Worsening Chronicity:  Recurrent Relieved by:  Nothing Associated symptoms: no congestion     Past Medical History  Diagnosis Date  . Headache(784.0)    History reviewed. No pertinent past surgical history. History reviewed. No pertinent family history. History  Substance Use Topics  . Smoking status: Current Every Day Smoker -- 0.50 packs/day    Types: Cigarettes  . Smokeless tobacco: Not on file  . Alcohol Use: No    Review of Systems  Constitutional: Negative for appetite change and fatigue.  HENT: Negative for congestion, ear discharge and sinus pressure.   Eyes: Negative for discharge.  Respiratory: Positive for cough.   Cardiovascular: Negative for chest pain.  Gastrointestinal: Negative for abdominal pain and diarrhea.  Genitourinary: Negative for frequency and hematuria.  Musculoskeletal: Negative for back pain.  Skin: Negative for rash.  Neurological: Negative for seizures and headaches.  Psychiatric/Behavioral: Negative for hallucinations.      Allergies  Ultram  Home Medications   Prior to Admission medications   Medication Sig Start Date End Date Taking? Authorizing Provider  azithromycin (ZITHROMAX Z-PAK) 250 MG tablet 2 po day one, then 1 daily x 4 days 08/14/14   Bethann BerkshireJoseph Nyesha Cliff, MD  HYDROcodone-acetaminophen (NORCO/VICODIN) 5-325 MG per tablet Take 2 tablets by mouth every 4 (four) hours as  needed. Patient not taking: Reported on 08/14/2014 10/24/13   Rolland PorterMark James, MD  ibuprofen (ADVIL,MOTRIN) 800 MG tablet Take 1 tablet (800 mg total) by mouth every 8 (eight) hours as needed for moderate pain. 08/14/14   Bethann BerkshireJoseph Azalie Harbeck, MD  methocarbamol (ROBAXIN) 500 MG tablet Take 1 tablet (500 mg total) by mouth 2 (two) times daily. Patient not taking: Reported on 08/14/2014 10/24/13   Rolland PorterMark James, MD  naproxen (NAPROSYN) 500 MG tablet Take 1 tablet (500 mg total) by mouth 2 (two) times daily. Patient not taking: Reported on 08/14/2014 10/24/13   Rolland PorterMark James, MD  ondansetron (ZOFRAN) 4 MG tablet Take 1 tablet (4 mg total) by mouth every 8 (eight) hours as needed for nausea. Patient not taking: Reported on 08/14/2014 08/18/12   Samuel JesterKathleen McManus, DO   BP 124/78 mmHg  Pulse 89  Temp(Src) 98.5 F (36.9 C) (Oral)  Resp 16  Ht 5\' 4"  (1.626 m)  Wt 180 lb (81.647 kg)  BMI 30.88 kg/m2  SpO2 99% Physical Exam  Constitutional: He is oriented to person, place, and time. He appears well-developed.  HENT:  Head: Normocephalic.  Eyes: Conjunctivae and EOM are normal. No scleral icterus.  Neck: Neck supple. No thyromegaly present.  Cardiovascular: Normal rate and regular rhythm.  Exam reveals no gallop and no friction rub.   No murmur heard. Pulmonary/Chest: No stridor. He has no wheezes. He has no rales. He exhibits no tenderness.  Abdominal: He exhibits no distension. There is no tenderness. There is no rebound.  Musculoskeletal: Normal range of motion. He exhibits no edema.  Lymphadenopathy:    He has no cervical adenopathy.  Neurological: He is oriented to person, place, and time. He exhibits normal muscle tone. Coordination normal.  Skin: No rash noted. No erythema.  Psychiatric: He has a normal mood and affect. His behavior is normal.    ED Course  Procedures (including critical care time) Labs Review Labs Reviewed - No data to display  Imaging Review Dg Chest 2 View  08/14/2014   CLINICAL DATA:  One  day history of fever with cough and congestion  EXAM: CHEST  2 VIEW  COMPARISON:  December 29, 2011  FINDINGS: Lungs are clear. Heart size and pulmonary vascularity are normal. No adenopathy. No bone lesions.  IMPRESSION: No edema or consolidation.   Electronically Signed   By: Bretta BangWilliam  Woodruff III M.D.   On: 08/14/2014 07:54     EKG Interpretation None      MDM   Final diagnoses:  Bronchitis    Bronchitis,  tx with zpak and motrin with follow up     Bethann BerkshireJoseph Lilyauna Miedema, MD 08/14/14 [phone]

## 2014-08-14 NOTE — ED Notes (Signed)
Fever ,  Achy, sorethroat, cough since yesterday.

## 2015-06-18 ENCOUNTER — Encounter (HOSPITAL_COMMUNITY): Payer: Self-pay | Admitting: *Deleted

## 2015-06-18 ENCOUNTER — Emergency Department (HOSPITAL_COMMUNITY)
Admission: EM | Admit: 2015-06-18 | Discharge: 2015-06-19 | Disposition: A | Discharge status: Home / Self Care | Payer: BLUE CROSS/BLUE SHIELD | Attending: Emergency Medicine | Admitting: Emergency Medicine

## 2015-06-18 ENCOUNTER — Emergency Department (HOSPITAL_COMMUNITY): Payer: BLUE CROSS/BLUE SHIELD

## 2015-06-18 DIAGNOSIS — F17210 Nicotine dependence, cigarettes, uncomplicated: Secondary | ICD-10-CM | POA: Diagnosis not present

## 2015-06-18 DIAGNOSIS — Y929 Unspecified place or not applicable: Secondary | ICD-10-CM | POA: Diagnosis not present

## 2015-06-18 DIAGNOSIS — Y939 Activity, unspecified: Secondary | ICD-10-CM | POA: Diagnosis not present

## 2015-06-18 DIAGNOSIS — Y999 Unspecified external cause status: Secondary | ICD-10-CM | POA: Insufficient documentation

## 2015-06-18 DIAGNOSIS — Z792 Long term (current) use of antibiotics: Secondary | ICD-10-CM | POA: Diagnosis not present

## 2015-06-18 DIAGNOSIS — S01512A Laceration without foreign body of oral cavity, initial encounter: Secondary | ICD-10-CM

## 2015-06-18 DIAGNOSIS — Z791 Long term (current) use of non-steroidal anti-inflammatories (NSAID): Secondary | ICD-10-CM | POA: Insufficient documentation

## 2015-06-18 DIAGNOSIS — S01501A Unspecified open wound of lip, initial encounter: Secondary | ICD-10-CM | POA: Insufficient documentation
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
## 2015-06-19 MED ORDER — NAPROXEN 500 MG PO TABS: 500.0000 mg | ORAL_TABLET | Freq: Two times a day (BID) | ORAL | Status: AC

## 2015-06-19 MED ORDER — HYDROCODONE-ACETAMINOPHEN 5-325 MG PO TABS: 1.0000 | ORAL_TABLET | ORAL | Status: AC | PRN

## 2015-06-19 MED ORDER — CLINDAMYCIN HCL 150 MG PO CAPS: 300.0000 mg | ORAL_CAPSULE | Freq: Three times a day (TID) | ORAL | Status: AC
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
## 2016-03-25 ENCOUNTER — Emergency Department (HOSPITAL_COMMUNITY)
Admission: EM | Admit: 2016-03-25 | Discharge: 2016-03-25 | Disposition: A | Discharge status: Home / Self Care | Payer: BLUE CROSS/BLUE SHIELD | Attending: Emergency Medicine | Admitting: Emergency Medicine

## 2016-03-25 ENCOUNTER — Encounter (HOSPITAL_COMMUNITY): Payer: Self-pay

## 2016-03-25 DIAGNOSIS — F17210 Nicotine dependence, cigarettes, uncomplicated: Secondary | ICD-10-CM | POA: Diagnosis not present

## 2016-03-25 DIAGNOSIS — K0889 Other specified disorders of teeth and supporting structures: Secondary | ICD-10-CM | POA: Diagnosis present

## 2016-03-25 DIAGNOSIS — K047 Periapical abscess without sinus: Secondary | ICD-10-CM

## 2016-03-25 MED ORDER — IBUPROFEN 800 MG PO TABS: 800.0000 mg | ORAL_TABLET | Freq: Three times a day (TID) | ORAL | 0 refills | Status: DC | PRN

## 2016-03-25 MED ORDER — PENICILLIN V POTASSIUM 500 MG PO TABS: 500.0000 mg | ORAL_TABLET | Freq: Four times a day (QID) | ORAL | 0 refills | Status: AC

## 2016-03-25 MED ORDER — ACETAMINOPHEN-CODEINE #3 300-30 MG PO TABS: 1.0000 | ORAL_TABLET | Freq: Four times a day (QID) | ORAL | 0 refills | Status: AC | PRN

## 2016-03-25 NOTE — ED Provider Notes (Signed)
[facility]-EMER[facility]ENCY DEPT Provider Note   CSN: [id number] Arrival date & time: 03/25/16  1009     History   Chief Complaint Chief Complaint  Patient presents with  . Jaw Pain    HPI Alon L Martorelli is a 35 y.o. male.  HPI Patient presents to the emergency department with dental pain and swelling.  The patient states that 3 days ago he noticed he was having pain along the left upper gumline.  Patient states that he has several damaged teeth in this area.  The patient states that nothing seems make the condition better or worse.  Patient did not take any medications prior to arrival. Patient denies tongue swelling, throat swelling, difficulty swallowing, difficulty breathing, shortness of breath, chest pain or syncope Past Medical History:  Diagnosis Date  . Headache(784.0)     There are no active problems to display for this patient.   History reviewed. No pertinent surgical history.       Home Medications    Prior to Admission medications   Medication Sig Start Date End Date Taking? Authorizing Provider  azithromycin (ZITHROMAX Z-PAK) 250 MG tablet 2 po day one, then 1 daily x 4 days 08/14/14   Bethann BerkshireJoseph Zammit, MD  clindamycin (CLEOCIN) 150 MG capsule Take 2 capsules (300 mg total) by mouth 3 (three) times daily. 06/19/15   Hope Orlene OchM Neese, NP  HYDROcodone-acetaminophen (NORCO/VICODIN) 5-325 MG tablet Take 1 tablet by mouth every 4 (four) hours as needed. 06/19/15   Hope Orlene OchM Neese, NP  ibuprofen (ADVIL,MOTRIN) 800 MG tablet Take 1 tablet (800 mg total) by mouth every 8 (eight) hours as needed for moderate pain. 08/14/14   Bethann BerkshireJoseph Zammit, MD  methocarbamol (ROBAXIN) 500 MG tablet Take 1 tablet (500 mg total) by mouth 2 (two) times daily. Patient not taking: Reported on 08/14/2014 10/24/13   Rolland PorterMark James, MD  naproxen (NAPROSYN) 500 MG tablet Take 1 tablet (500 mg total) by mouth 2 (two) times daily. 06/19/15   Hope Orlene OchM Neese, NP  ondansetron (ZOFRAN) 4 MG tablet Take 1 tablet (4 mg total) by  mouth every 8 (eight) hours as needed for nausea. Patient not taking: Reported on 08/14/2014 08/18/12   Samuel JesterKathleen McManus, DO    Family History No family history on file.  Social History Social History  Substance Use Topics  . Smoking status: Current Every Day Smoker    Packs/day: 0.50    Types: Cigarettes  . Smokeless tobacco: Never Used  . Alcohol use No     Allergies   Ultram [tramadol]   Review of Systems Review of Systems All other systems negative except as documented in the HPI. All pertinent positives and negatives as reviewed in the HPI. Physical Exam Updated Vital Signs BP 130/88 (BP Location: Left Arm)   Pulse 66   Temp 98.6 F (37 C) (Oral)   Resp 20   Ht 5\' 4"  (1.626 m)   Wt 63.5 kg   SpO2 100%   BMI 24.03 kg/m   Physical Exam  Constitutional: He is oriented to person, place, and time. He appears well-developed and well-nourished. No distress.  HENT:  Head: Normocephalic and atraumatic.  Mouth/Throat: Oropharynx is clear and moist and mucous membranes are normal. No trismus in the jaw. Abnormal dentition. Dental abscesses and dental caries present. No uvula swelling or lacerations. No oropharyngeal exudate.  Eyes: Pupils are equal, round, and reactive to light.  Neck: Normal range of motion. Neck supple.  Cardiovascular: Normal rate, regular rhythm and normal heart sounds.  Exam reveals no gallop and no friction rub.   No murmur heard. Neurological: He is alert and oriented to person, place, and time. He exhibits normal muscle tone. Coordination normal.  Skin: Skin is warm and dry. No rash noted. No erythema.  Psychiatric: He has a normal mood and affect. His behavior is normal.  Nursing note and vitals reviewed.    ED Treatments / Results  Labs (all labs ordered are listed, but only abnormal results are displayed) Labs Reviewed - No data to display  EKG  EKG Interpretation None       Radiology No results found.  Procedures Procedures  (including critical care time)  Medications Ordered in ED Medications - No data to display   Initial Impression / Assessment and Plan / ED Course  I have reviewed the triage vital signs and the nursing notes.  Pertinent labs & imaging results that were available during my care of the patient were reviewed by me and considered in my medical decision making (see chart for details).  Clinical Course     Patient be treated for possible early dental abscess.  Told to return here as needed.  Patient agrees the plan and all questions were answered  Final Clinical Impressions(s) / ED Diagnoses   Final diagnoses:  None    New Prescriptions New Prescriptions   No medications on file     Charlestine NightChristopher Awab Abebe, PA-C 03/25/16 1049    Blane OharaJoshua Zavitz, MD 03/25/16 1554

## 2016-03-25 NOTE — Discharge Instructions (Signed)
Follow-up with the dentist provided.  Return here as needed.  Rinse with warm water, peroxide 3 times a day

## 2016-03-25 NOTE — ED Triage Notes (Signed)
Pt reports left jaw pain for the past 2 or 3 days.  Reports it hurts worse when he opens his mouth wide and says it "gets stuck" when he yawns.

## 2017-01-26 ENCOUNTER — Encounter (HOSPITAL_COMMUNITY): Payer: Self-pay | Admitting: *Deleted

## 2017-01-26 ENCOUNTER — Emergency Department (HOSPITAL_COMMUNITY)
Admission: EM | Admit: 2017-01-26 | Discharge: 2017-01-26 | Disposition: A | Discharge status: Home / Self Care | Payer: BLUE CROSS/BLUE SHIELD | Attending: Emergency Medicine | Admitting: Emergency Medicine

## 2017-01-26 ENCOUNTER — Emergency Department (HOSPITAL_COMMUNITY): Payer: BLUE CROSS/BLUE SHIELD

## 2017-01-26 DIAGNOSIS — F17210 Nicotine dependence, cigarettes, uncomplicated: Secondary | ICD-10-CM | POA: Insufficient documentation

## 2017-01-26 DIAGNOSIS — J209 Acute bronchitis, unspecified: Secondary | ICD-10-CM | POA: Diagnosis not present

## 2017-01-26 DIAGNOSIS — R0789 Other chest pain: Secondary | ICD-10-CM | POA: Insufficient documentation

## 2017-01-26 DIAGNOSIS — R042 Hemoptysis: Secondary | ICD-10-CM | POA: Insufficient documentation

## 2017-01-26 DIAGNOSIS — R0981 Nasal congestion: Secondary | ICD-10-CM | POA: Diagnosis present

## 2017-01-26 DIAGNOSIS — Z79899 Other long term (current) drug therapy: Secondary | ICD-10-CM | POA: Diagnosis not present

## 2017-01-26 MED ORDER — ALBUTEROL SULFATE (2.5 MG/3ML) 0.083% IN NEBU
2.5000 mg | INHALATION_SOLUTION | Freq: Once | RESPIRATORY_TRACT | Status: AC
  Administered 2017-01-26: 2.5 mg via RESPIRATORY_TRACT
  Filled 2017-01-26: qty 3

## 2017-01-26 MED ORDER — BENZONATATE 100 MG PO CAPS: 200.0000 mg | ORAL_CAPSULE | Freq: Three times a day (TID) | ORAL | 0 refills | Status: AC | PRN

## 2017-01-26 MED ORDER — ALBUTEROL SULFATE HFA 108 (90 BASE) MCG/ACT IN AERS
1.0000 | INHALATION_SPRAY | Freq: Once | RESPIRATORY_TRACT | Status: AC
  Administered 2017-01-26: 1 via RESPIRATORY_TRACT
  Filled 2017-01-26: qty 6.7

## 2017-01-26 MED ORDER — AZITHROMYCIN 250 MG PO TABS: ORAL_TABLET | ORAL | 0 refills | Status: AC

## 2017-01-26 MED ORDER — IPRATROPIUM-ALBUTEROL 0.5-2.5 (3) MG/3ML IN SOLN
3.0000 mL | Freq: Once | RESPIRATORY_TRACT | Status: AC
Start: 2017-01-26 — End: 2017-01-26
  Administered 2017-01-26: 3 mL via RESPIRATORY_TRACT
  Filled 2017-01-26: qty 3

## 2017-01-26 NOTE — ED Triage Notes (Addendum)
Pt c/o yellow nasal drainage, fatigue, productive cough that started yesterday. Pt reports he woke up this morning coughing up "clots of blood" and having chest "tightness". Denies fever, nausea, vomiting, diarrhea.

## 2017-01-26 NOTE — Discharge Instructions (Signed)
Use 2 puffs every 2 hours of the inhaler given if you are coughing, short of breath or wheezing. As discussed,  this is probably a viral infection which will get better without antibiotics, however if you are not feeling better by Wednesday, get the zithromax prescription filled and take until completed.

## 2017-01-28 NOTE — ED Provider Notes (Signed)
[facility] EMERGENCY DEPARTMENT Provider Note   CSN: [id number] Arrival date & time: 01/26/17  1021     History   Chief Complaint Chief Complaint  Patient presents with  . Hemoptysis  . Chest Pain    HPI Antonio Pace is a 36 y.o. male with no significant medical history presenting with a one-day history of URI type symptoms.  He reports developing nasal congestion along with yellow nasal drainage in association with generalized fatigue and a productive cough.  He reports feeling chest tightness and has had wheezing which is worse when supine.  This morning he endorses a cough pain yellow sputum with blood streaks.  He has had no nosebleeds, denies postnasal drip.  Additionally he denies fevers, chills, nausea vomiting or diarrhea.  Additionally he denies chest pain.  He has had no medications for his symptoms prior to arrival.  He is a 1 pack/day smoker.  The history is provided by the patient.    Past Medical History:  Diagnosis Date  . Headache(784.0)     There are no active problems to display for this patient.   History reviewed. No pertinent surgical history.     Home Medications    Prior to Admission medications   Medication Sig Start Date End Date Taking? Authorizing Provider  acetaminophen-codeine (TYLENOL #3) 300-30 MG tablet Take 1-2 tablets by mouth every 6 (six) hours as needed for moderate pain. Patient not taking: Reported on 01/26/2017 03/25/16   Charlestine Night, PA-C  azithromycin (ZITHROMAX Z-PAK) 250 MG tablet Take 2 tablets by mouth on day one followed by one tablet daily for 4 days. 01/26/17   Burgess Amor, PA-C  benzonatate (TESSALON) 100 MG capsule Take 2 capsules (200 mg total) by mouth 3 (three) times daily as needed for cough. 01/26/17   Burgess Amor, PA-C  clindamycin (CLEOCIN) 150 MG capsule Take 2 capsules (300 mg total) by mouth 3 (three) times daily. Patient not taking: Reported on 01/26/2017 06/19/15   Janne Napoleon, NP    HYDROcodone-acetaminophen (NORCO/VICODIN) 5-325 MG tablet Take 1 tablet by mouth every 4 (four) hours as needed. Patient not taking: Reported on 01/26/2017 06/19/15   Janne Napoleon, NP  ibuprofen (ADVIL,MOTRIN) 800 MG tablet Take 1 tablet (800 mg total) by mouth every 8 (eight) hours as needed. Patient not taking: Reported on 01/26/2017 03/25/16   Charlestine Night, PA-C  methocarbamol (ROBAXIN) 500 MG tablet Take 1 tablet (500 mg total) by mouth 2 (two) times daily. Patient not taking: Reported on 08/14/2014 10/24/13   Rolland Porter, MD  naproxen (NAPROSYN) 500 MG tablet Take 1 tablet (500 mg total) by mouth 2 (two) times daily. Patient not taking: Reported on 01/26/2017 06/19/15   Janne Napoleon, NP  ondansetron (ZOFRAN) 4 MG tablet Take 1 tablet (4 mg total) by mouth every 8 (eight) hours as needed for nausea. Patient not taking: Reported on 08/14/2014 08/18/12   Samuel Jester, DO  penicillin v potassium (VEETID) 500 MG tablet Take 1 tablet (500 mg total) by mouth 4 (four) times daily. Patient not taking: Reported on 01/26/2017 03/25/16   Charlestine Night, PA-C    Family History No family history on file.  Social History Social History  Substance Use Topics  . Smoking status: Current Every Day Smoker    Packs/day: 0.50    Types: Cigarettes  . Smokeless tobacco: Never Used  . Alcohol use Yes     Comment: occasionally     Allergies   Ultram [tramadol]  Review of Systems Review of Systems  Constitutional: Negative for chills and fever.  HENT: Positive for congestion and rhinorrhea. Negative for ear pain, sinus pressure, sore throat, trouble swallowing and voice change.   Eyes: Negative for discharge.  Respiratory: Positive for cough, chest tightness, shortness of breath and wheezing. Negative for stridor.   Cardiovascular: Negative for chest pain.  Gastrointestinal: Negative for abdominal pain, nausea and vomiting.  Genitourinary: Negative.      Physical Exam Updated  Vital Signs BP 126/90 (BP Location: Left Arm)   Pulse 92   Temp 98.7 F (37.1 C) (Oral)   Resp 18   Ht 5\' 4"  (1.626 m)   Wt 63.5 kg (140 lb)   SpO2 100%   BMI 24.03 kg/m   Physical Exam  Constitutional: He is oriented to person, place, and time. He appears well-developed and well-nourished.  HENT:  Head: Normocephalic and atraumatic.  Right Ear: Tympanic membrane and ear canal normal.  Left Ear: Tympanic membrane and ear canal normal.  Nose: Mucosal edema and rhinorrhea present.  Mouth/Throat: Uvula is midline, oropharynx is clear and moist and mucous membranes are normal. No oropharyngeal exudate, posterior oropharyngeal edema, posterior oropharyngeal erythema or tonsillar abscesses.  Eyes: Conjunctivae are normal.  Cardiovascular: Normal rate and normal heart sounds.   Pulmonary/Chest: Effort normal. No respiratory distress. He has no wheezes. He has no rhonchi. He has no rales.  Decreased breath sounds throughout all lung fields without wheezing.  No stridor.  Abdominal: Soft. There is no tenderness.  Musculoskeletal: Normal range of motion.  Neurological: He is alert and oriented to person, place, and time.  Skin: Skin is warm and dry. No rash noted.  Psychiatric: He has a normal mood and affect.     ED Treatments / Results  Labs (all labs ordered are listed, but only abnormal results are displayed) Labs Reviewed - No data to display  EKG  EKG Interpretation None       Radiology  Dg Chest 2 View  Result Date: 01/26/2017 CLINICAL DATA:  Cough since yesterday. EXAM: CHEST  2 VIEW COMPARISON:  PA and lateral chest 08/13/2016 band 12/29/2011. FINDINGS: Lungs are clear. Heart size is normal. No pneumothorax or pleural fluid. No bony abnormality. IMPRESSION: Negative chest. Electronically Signed   By: Drusilla Kanner M.D.   On: 01/26/2017 11:52     Procedures Procedures (including critical care time)  Medications Ordered in ED Medications    ipratropium-albuterol (DUONEB) 0.5-2.5 (3) MG/3ML nebulizer solution 3 mL (3 mLs Nebulization Given 01/26/17 1217)  albuterol (PROVENTIL) (2.5 MG/3ML) 0.083% nebulizer solution 2.5 mg (2.5 mg Nebulization Given 01/26/17 1217)  albuterol (PROVENTIL HFA;VENTOLIN HFA) 108 (90 Base) MCG/ACT inhaler 1 puff (1 puff Inhalation Given 01/26/17 1356)     Initial Impression / Assessment and Plan / ED Course  I have reviewed the triage vital signs and the nursing notes.  Pertinent labs & imaging results that were available during my care of the patient were reviewed by me and considered in my medical decision making (see chart for details).     Patient was given an albuterol and Atrovent nebulizer treatment with significant improvement in symptoms and improved aeration on repeat exam.  Still no wheezing.  He was given an albuterol MDI for home use with a spacer and instructed in its use.  Chest x-ray was reviewed with patient and is negative.  Suspect acute bronchitis.  Smoking cessation discussed.  He was prescribed Tessalon for cough.  Also prescribed Zithromax, but discussed  with patient he may improve without need of this medication, but given since he is a smoker with history of hemoptysis.  Advised to hold this medication prescription but to get filled if not improving over the next 48 hours with albuterol and Tessalon.  Final Clinical Impressions(s) / ED Diagnoses   Final diagnoses:  Acute bronchitis, unspecified organism    New Prescriptions Discharge Medication List as of 01/26/2017  1:19 PM    START taking these medications   Details  benzonatate (TESSALON) 100 MG capsule Take 2 capsules (200 mg total) by mouth 3 (three) times daily as needed for cough., Starting Mon 01/26/2017, Print         Burgess Amordol, Artem Bunte, PA-C 01/28/17 2253    Bethann BerkshireZammit, Joseph, MD 01/29/17 1520

## 2017-05-14 ENCOUNTER — Encounter (HOSPITAL_COMMUNITY): Payer: Self-pay | Admitting: Cardiology

## 2017-05-14 ENCOUNTER — Emergency Department (HOSPITAL_COMMUNITY)
Admission: EM | Admit: 2017-05-14 | Discharge: 2017-05-14 | Disposition: A | Discharge status: Home / Self Care | Payer: BLUE CROSS/BLUE SHIELD | Attending: Emergency Medicine | Admitting: Emergency Medicine

## 2017-05-14 DIAGNOSIS — S30810A Abrasion of lower back and pelvis, initial encounter: Secondary | ICD-10-CM | POA: Insufficient documentation

## 2017-05-14 DIAGNOSIS — S300XXA Contusion of lower back and pelvis, initial encounter: Secondary | ICD-10-CM | POA: Diagnosis not present

## 2017-05-14 DIAGNOSIS — F17210 Nicotine dependence, cigarettes, uncomplicated: Secondary | ICD-10-CM | POA: Insufficient documentation

## 2017-05-14 DIAGNOSIS — Y929 Unspecified place or not applicable: Secondary | ICD-10-CM | POA: Insufficient documentation

## 2017-05-14 DIAGNOSIS — S3982XA Other specified injuries of lower back, initial encounter: Secondary | ICD-10-CM | POA: Diagnosis present

## 2017-05-14 DIAGNOSIS — Y939 Activity, unspecified: Secondary | ICD-10-CM | POA: Diagnosis not present

## 2017-05-14 DIAGNOSIS — Y999 Unspecified external cause status: Secondary | ICD-10-CM | POA: Diagnosis not present

## 2017-05-14 DIAGNOSIS — W228XXA Striking against or struck by other objects, initial encounter: Secondary | ICD-10-CM | POA: Insufficient documentation

## 2017-05-14 MED ORDER — IBUPROFEN 600 MG PO TABS: 600.0000 mg | ORAL_TABLET | Freq: Four times a day (QID) | ORAL | 0 refills | Status: AC

## 2017-05-14 MED ORDER — CYCLOBENZAPRINE HCL 10 MG PO TABS: 10.0000 mg | ORAL_TABLET | Freq: Three times a day (TID) | ORAL | 0 refills | Status: AC

## 2017-05-14 MED ORDER — HYDROCODONE-ACETAMINOPHEN 5-325 MG PO TABS
2.0000 | ORAL_TABLET | Freq: Once | ORAL | Status: AC
  Administered 2017-05-14: 2 via ORAL
  Filled 2017-05-14: qty 2

## 2017-05-14 MED ORDER — DIAZEPAM 5 MG PO TABS
10.0000 mg | ORAL_TABLET | Freq: Once | ORAL | Status: AC
  Administered 2017-05-14: 10 mg via ORAL
  Filled 2017-05-14: qty 2

## 2017-05-14 MED ORDER — BACITRACIN-NEOMYCIN-POLYMYXIN 400-5-5000 EX OINT
TOPICAL_OINTMENT | Freq: Once | CUTANEOUS | Status: AC
  Administered 2017-05-14: 1 via TOPICAL
  Filled 2017-05-14: qty 1

## 2017-05-14 MED ORDER — ONDANSETRON HCL 4 MG PO TABS
4.0000 mg | ORAL_TABLET | Freq: Once | ORAL | Status: AC
  Administered 2017-05-14: 4 mg via ORAL
  Filled 2017-05-14: qty 1

## 2017-05-14 NOTE — ED Triage Notes (Signed)
Hit with a piece of hardwood flooring on back.  Pt has abrasion and redness  to the  area.

## 2017-05-14 NOTE — Discharge Instructions (Signed)
Please apply ice to the affected area.  Please rest her back is much as possible.  Use Flexeril 3 times daily.  Use ibuprofen with breakfast, lunch, dinner, and at bedtime.  Please see your primary physician or return to the emergency department if not improving.

## 2017-05-14 NOTE — ED Provider Notes (Signed)
[facility] EMERGENCY DEPARTMENT Provider Note   CSN: [id number] Arrival date & time: 05/14/17  1259     History   Chief Complaint Chief Complaint  Patient presents with  . Back Pain    HPI Antonio Pace is a 37 y.o. male.  Patient is a 37 year old male who presents to the emergency department with an injury to the lower back.  The patient states that he was hit with a piece of hardwood flooring earlier today.  He sustained an abrasion.  He states that he is up-to-date on tetanus.  Patient denies being on any anticoagulation medications.  He has no history of bleeding disorder.  He is not had any previous operations or procedures involving his lower back.  There has been no loss of bowel or bladder function since this incident.  The patient is able to ambulate, but states that certain movements cause increase in pain.  No shortness of breath reported.  No hemoptysis reported.   The history is provided by the patient.  Back Pain   Pertinent negatives include no chest pain, no abdominal pain and no dysuria.    Past Medical History:  Diagnosis Date  . Headache(784.0)     There are no active problems to display for this patient.   History reviewed. No pertinent surgical history.     Home Medications    Prior to Admission medications   Medication Sig Start Date End Date Taking? Authorizing Provider  acetaminophen-codeine (TYLENOL #3) 300-30 MG tablet Take 1-2 tablets by mouth every 6 (six) hours as needed for moderate pain. Patient not taking: Reported on 01/26/2017 03/25/16   Charlestine Night, PA-C  azithromycin (ZITHROMAX Z-PAK) 250 MG tablet Take 2 tablets by mouth on day one followed by one tablet daily for 4 days. 01/26/17   Burgess Amor, PA-C  benzonatate (TESSALON) 100 MG capsule Take 2 capsules (200 mg total) by mouth 3 (three) times daily as needed for cough. 01/26/17   Burgess Amor, PA-C  clindamycin (CLEOCIN) 150 MG capsule Take 2 capsules (300 mg  total) by mouth 3 (three) times daily. Patient not taking: Reported on 01/26/2017 06/19/15   Janne Napoleon, NP  HYDROcodone-acetaminophen (NORCO/VICODIN) 5-325 MG tablet Take 1 tablet by mouth every 4 (four) hours as needed. Patient not taking: Reported on 01/26/2017 06/19/15   Janne Napoleon, NP  ibuprofen (ADVIL,MOTRIN) 800 MG tablet Take 1 tablet (800 mg total) by mouth every 8 (eight) hours as needed. Patient not taking: Reported on 01/26/2017 03/25/16   Charlestine Night, PA-C  methocarbamol (ROBAXIN) 500 MG tablet Take 1 tablet (500 mg total) by mouth 2 (two) times daily. Patient not taking: Reported on 08/14/2014 10/24/13   Rolland Porter, MD  naproxen (NAPROSYN) 500 MG tablet Take 1 tablet (500 mg total) by mouth 2 (two) times daily. Patient not taking: Reported on 01/26/2017 06/19/15   Janne Napoleon, NP  ondansetron (ZOFRAN) 4 MG tablet Take 1 tablet (4 mg total) by mouth every 8 (eight) hours as needed for nausea. Patient not taking: Reported on 08/14/2014 08/18/12   Samuel Jester, DO  penicillin v potassium (VEETID) 500 MG tablet Take 1 tablet (500 mg total) by mouth 4 (four) times daily. Patient not taking: Reported on 01/26/2017 03/25/16   Charlestine Night, PA-C    Family History History reviewed. No pertinent family history.  Social History Social History   Tobacco Use  . Smoking status: Current Every Day Smoker    Packs/day: 0.50    Types: Cigarettes  .  Smokeless tobacco: Never Used  Substance Use Topics  . Alcohol use: Yes    Comment: occasionally  . Drug use: No     Allergies   Ultram [tramadol]   Review of Systems Review of Systems  Constitutional: Negative for activity change.       All ROS Neg except as noted in HPI  HENT: Negative for nosebleeds.   Eyes: Negative for photophobia and discharge.  Respiratory: Negative for cough, shortness of breath and wheezing.   Cardiovascular: Negative for chest pain and palpitations.  Gastrointestinal: Negative for  abdominal pain and blood in stool.  Genitourinary: Negative for dysuria, frequency and hematuria.  Musculoskeletal: Positive for back pain. Negative for arthralgias and neck pain.  Skin: Negative.   Neurological: Negative for dizziness, seizures and speech difficulty.  Psychiatric/Behavioral: Negative for confusion and hallucinations.     Physical Exam Updated Vital Signs BP (!) 146/90   Pulse 100   Temp 98.2 F (36.8 C) (Oral)   Resp 16   Ht 5\' 4"  (1.626 m)   Wt 63.5 kg (140 lb)   SpO2 99%   BMI 24.03 kg/m   Physical Exam  Constitutional: He is oriented to person, place, and time. He appears well-developed and well-nourished.  Non-toxic appearance.  HENT:  Head: Normocephalic.  Right Ear: Tympanic membrane and external ear normal.  Left Ear: Tympanic membrane and external ear normal.  Eyes: EOM and lids are normal. Pupils are equal, round, and reactive to light.  Neck: Normal range of motion. Neck supple. Carotid bruit is not present.  Cardiovascular: Normal rate, regular rhythm, normal heart sounds, intact distal pulses and normal pulses.  Pulmonary/Chest: Breath sounds normal. No respiratory distress.  Abdominal: Soft. Bowel sounds are normal. There is no tenderness. There is no guarding.  Musculoskeletal: Normal range of motion.       Lumbar back: He exhibits tenderness, pain and spasm.       Back:  Lymphadenopathy:       Head (right side): No submandibular adenopathy present.       Head (left side): No submandibular adenopathy present.    He has no cervical adenopathy.  Neurological: He is alert and oriented to person, place, and time. He has normal strength. No cranial nerve deficit or sensory deficit.  Skin: Skin is warm and dry.  Psychiatric: He has a normal mood and affect. His speech is normal.  Nursing note and vitals reviewed.    ED Treatments / Results  Labs (all labs ordered are listed, but only abnormal results are displayed) Labs Reviewed - No data  to display  EKG  EKG Interpretation None       Radiology No results found.  Procedures Procedures (including critical care time)  Medications Ordered in ED Medications - No data to display   Initial Impression / Assessment and Plan / ED Course  I have reviewed the triage vital signs and the nursing notes.  Pertinent labs & imaging results that were available during my care of the patient were reviewed by me and considered in my medical decision making (see chart for details).       Final Clinical Impressions(s) / ED Diagnoses MDM  Vital signs reviewed.  Pulse oximetry is 99% on room air.  Within normal limits by my interpretation. Patient has an abrasion to the left lower back.  He states that his tetanus is up-to-date.  There is symmetrical rise and fall of the chest.  No crepitus appreciated of the left or the  right chest.  The patient will have a dressing applied to the abrasion area.  We will use a muscle relaxer and pain medication.  Patient will also be provided with an ice pack.  Patient is to follow-up with his primary physician or return to the emergency department if any changes or problems.   Final diagnoses:  Contusion of lower back, initial encounter  Abrasion of lower back, initial encounter    ED Discharge Orders    None       Ivery QualeBryant, Arwa Yero, PA-C 05/15/17 0740    Loren RacerYelverton, David, MD 05/15/17 1215

## 2018-05-21 IMAGING — DX DG CHEST 2V
2 series · 2 of 2 positions shown · non-contrast
Comparison: PA and lateral chest 08/13/2016 band 12/29/2011.

CLINICAL DATA: Cough since yesterday.

EXAM:
CHEST  2 VIEW

[chest pa]
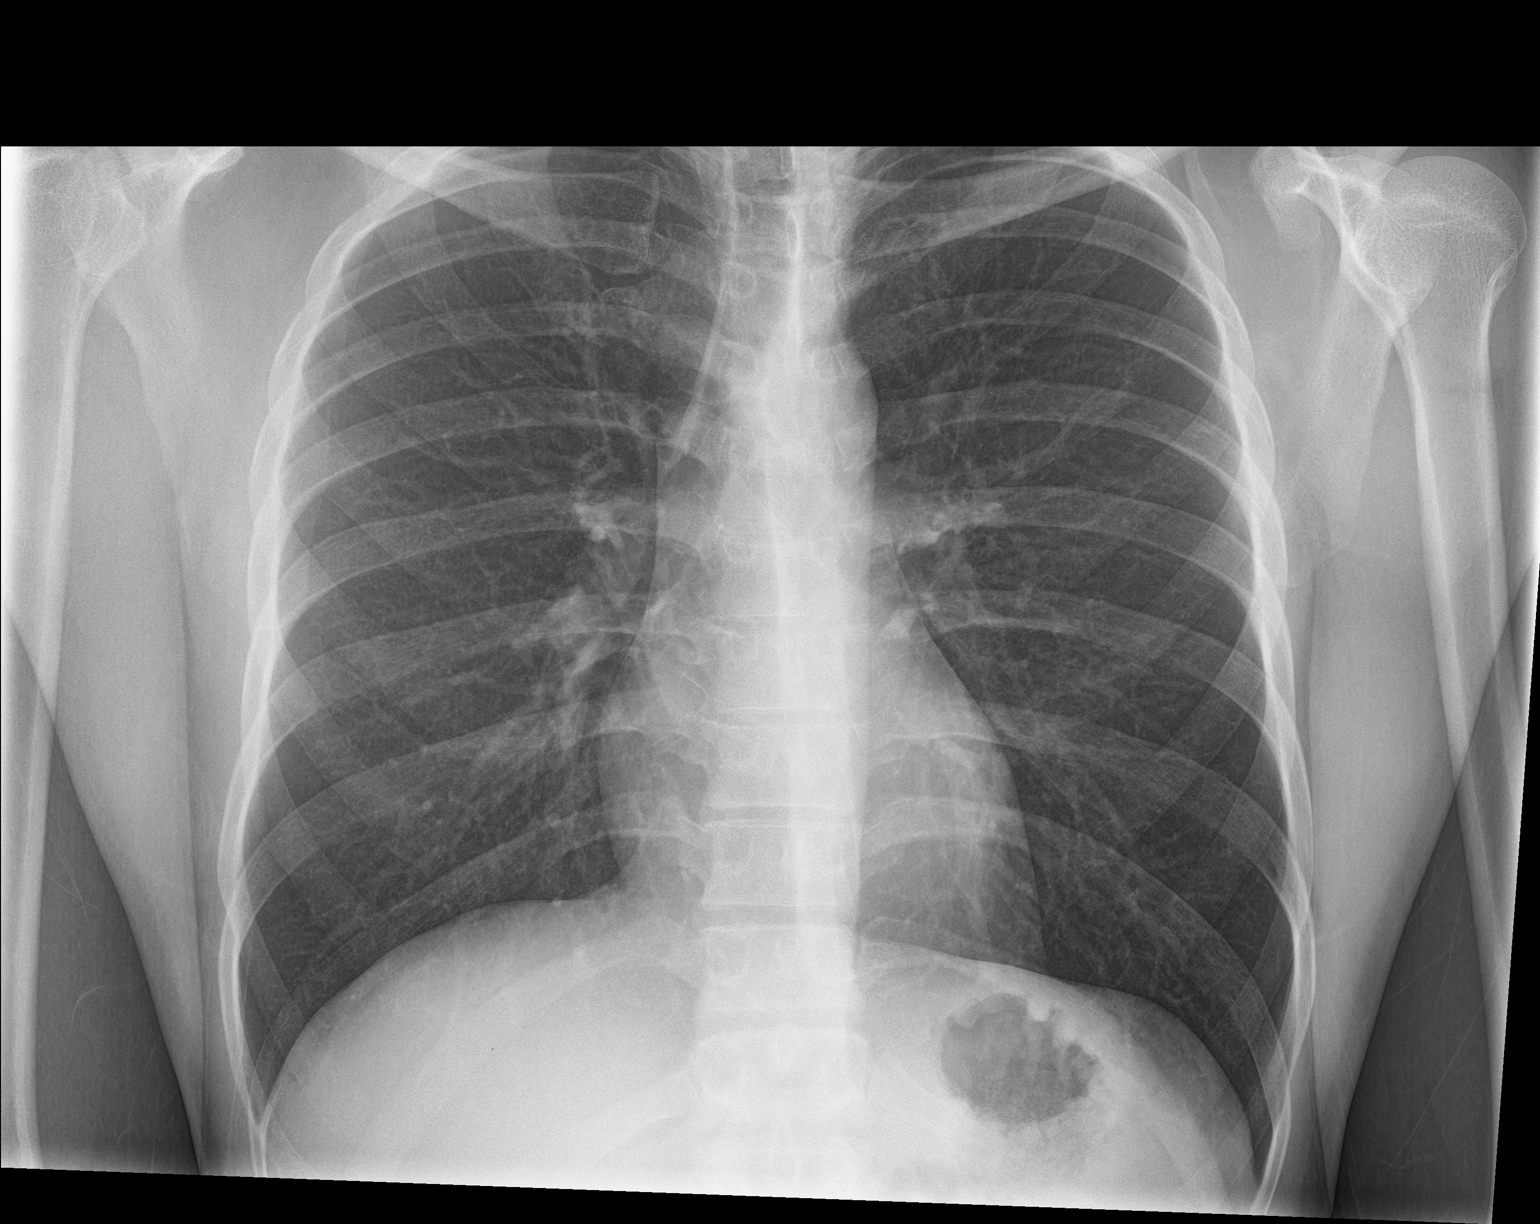

[chest lat]
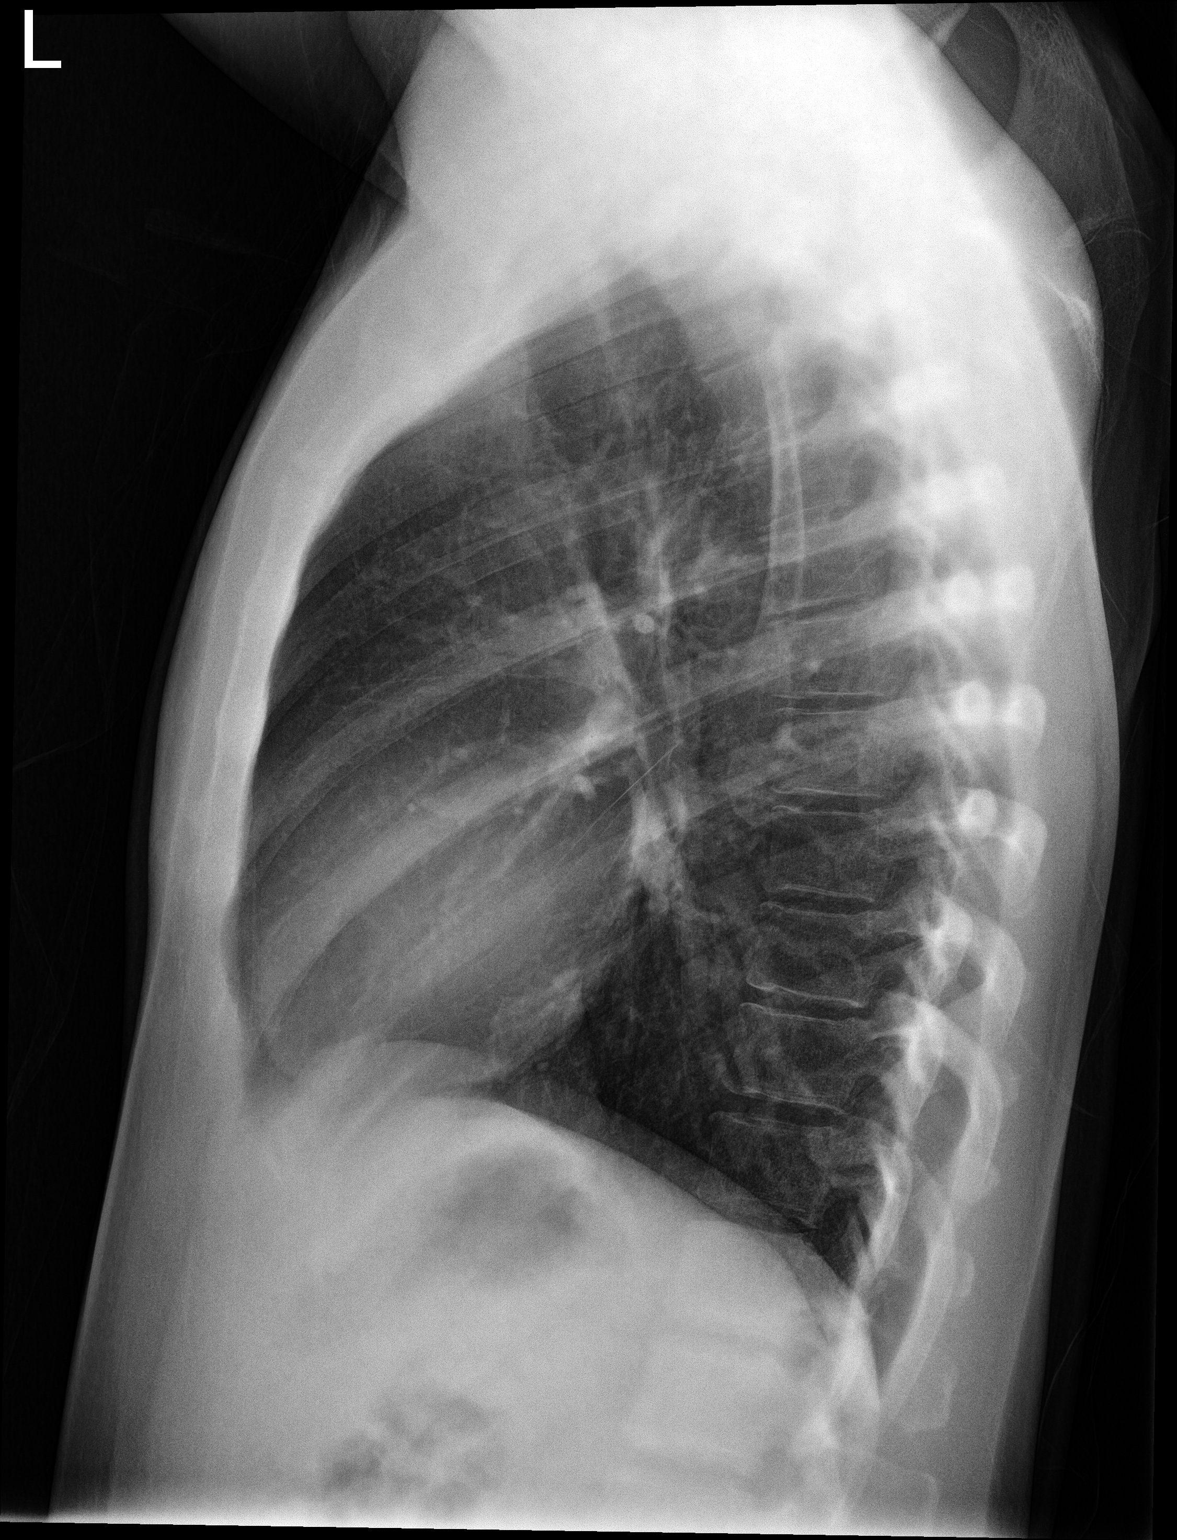

[2 of 2 positions shown; findings below may reference images not displayed]

FINDINGS: Lungs are clear. Heart size is normal. No pneumothorax or pleural
fluid. No bony abnormality.
IMPRESSION: Negative chest.

## 2020-08-24 ENCOUNTER — Encounter (HOSPITAL_COMMUNITY): Payer: Self-pay | Admitting: *Deleted

## 2020-08-24 ENCOUNTER — Other Ambulatory Visit: Payer: Self-pay

## 2020-08-24 ENCOUNTER — Emergency Department (HOSPITAL_COMMUNITY)
Admission: EM | Admit: 2020-08-24 | Discharge: 2020-08-24 | Disposition: A | Discharge status: Home / Self Care | Payer: BC Managed Care – PPO | Attending: Emergency Medicine | Admitting: Emergency Medicine

## 2020-08-24 DIAGNOSIS — L299 Pruritus, unspecified: Secondary | ICD-10-CM | POA: Diagnosis not present

## 2020-08-24 DIAGNOSIS — R21 Rash and other nonspecific skin eruption: Secondary | ICD-10-CM | POA: Diagnosis present

## 2020-08-24 DIAGNOSIS — F17210 Nicotine dependence, cigarettes, uncomplicated: Secondary | ICD-10-CM | POA: Diagnosis not present

## 2020-08-24 DIAGNOSIS — L249 Irritant contact dermatitis, unspecified cause: Secondary | ICD-10-CM | POA: Diagnosis not present

## 2020-08-24 MED ORDER — HYDROXYZINE HCL 25 MG PO TABS: 25.0000 mg | ORAL_TABLET | Freq: Four times a day (QID) | ORAL | 0 refills | Status: AC | PRN

## 2020-08-24 MED ORDER — HYDROXYZINE HCL 25 MG PO TABS
50.0000 mg | ORAL_TABLET | Freq: Once | ORAL | Status: AC
  Administered 2020-08-24: 50 mg via ORAL
  Filled 2020-08-24: qty 2

## 2020-08-24 NOTE — ED Provider Notes (Signed)
[facility] EMERGENCY DEPARTMENT Provider Note   CSN: [id number] Arrival date & time: 08/24/20  1607     History Chief Complaint  Patient presents with  . Urticaria    Antonio Pace is a 40 y.o. male with no significant past medical history presenting for evaluation of a generalized pruritic rash which started several days ago Dermalene his lawn.  He describes itching predominantly on his arms and legs but also involving his back.  He was seen at [facility] and was started on a Decadron tapering dose, was advised to take Benadryl which he states has not relieved the itching.  Additionally he was prescribed permethrin which the patient thought was for itching.  When I told the patient with this medication was for he states that they discussed scabies at the other hospital but he was told he did not have this condition and was not being treated for that.  He denies any exposures to new soaps, lotions, no food exposures, new medicines or any other potential source for this rash.  He states the itching got worse after he used this permethrin cream.  He denies shortness of breath, mouth or throat swelling, fevers or chills.  HPI     Past Medical History:  Diagnosis Date  . Headache(784.0)     There are no problems to display for this patient.   History reviewed. No pertinent surgical history.     No family history on file.  Social History   Tobacco Use  . Smoking status: Current Every Day Smoker    Packs/day: 0.50    Types: Cigarettes  . Smokeless tobacco: Never Used  Substance Use Topics  . Alcohol use: Yes    Comment: occasionally  . Drug use: No    Home Medications Prior to Admission medications   Medication Sig Start Date End Date Taking? Authorizing Provider  hydrOXYzine (ATARAX/VISTARIL) 25 MG tablet Take 1-2 tablets (25-50 mg total) by mouth every 6 (six) hours as needed for itching. 08/24/20  Yes Esau Fridman, Raynelle Fanning, PA-C  acetaminophen-codeine (TYLENOL  #3) 300-30 MG tablet Take 1-2 tablets by mouth every 6 (six) hours as needed for moderate pain. Patient not taking: Reported on 01/26/2017 03/25/16   Charlestine Night, PA-C  azithromycin (ZITHROMAX Z-PAK) 250 MG tablet Take 2 tablets by mouth on day one followed by one tablet daily for 4 days. 01/26/17   Burgess Amor, PA-C  benzonatate (TESSALON) 100 MG capsule Take 2 capsules (200 mg total) by mouth 3 (three) times daily as needed for cough. 01/26/17   Burgess Amor, PA-C  clindamycin (CLEOCIN) 150 MG capsule Take 2 capsules (300 mg total) by mouth 3 (three) times daily. Patient not taking: Reported on 01/26/2017 06/19/15   Janne Napoleon, NP  cyclobenzaprine (FLEXERIL) 10 MG tablet Take 1 tablet (10 mg total) by mouth 3 (three) times daily. 05/14/17   Ivery Quale, PA-C  HYDROcodone-acetaminophen (NORCO/VICODIN) 5-325 MG tablet Take 1 tablet by mouth every 4 (four) hours as needed. Patient not taking: Reported on 01/26/2017 06/19/15   Janne Napoleon, NP  ibuprofen (ADVIL,MOTRIN) 600 MG tablet Take 1 tablet (600 mg total) by mouth 4 (four) times daily. 05/14/17   Ivery Quale, PA-C  methocarbamol (ROBAXIN) 500 MG tablet Take 1 tablet (500 mg total) by mouth 2 (two) times daily. Patient not taking: Reported on 08/14/2014 10/24/13   Rolland Porter, MD  naproxen (NAPROSYN) 500 MG tablet Take 1 tablet (500 mg total) by mouth 2 (two) times daily. Patient not  taking: Reported on 01/26/2017 06/19/15   Janne Napoleon, NP  ondansetron (ZOFRAN) 4 MG tablet Take 1 tablet (4 mg total) by mouth every 8 (eight) hours as needed for nausea. Patient not taking: Reported on 08/14/2014 08/18/12   Samuel Jester, DO  penicillin v potassium (VEETID) 500 MG tablet Take 1 tablet (500 mg total) by mouth 4 (four) times daily. Patient not taking: Reported on 01/26/2017 03/25/16   Charlestine Night, PA-C    Allergies    Ultram [tramadol]  Review of Systems   Review of Systems  Constitutional: Negative for chills and fever.   HENT: Negative for congestion, sore throat and trouble swallowing.   Eyes: Negative.   Respiratory: Negative for chest tightness, shortness of breath, wheezing and stridor.   Cardiovascular: Negative for chest pain.  Gastrointestinal: Negative for abdominal pain, nausea and vomiting.  Musculoskeletal: Negative for arthralgias, joint swelling and neck pain.  Skin: Positive for rash. Negative for wound.  Allergic/Immunologic: Negative.   Neurological: Negative.   Psychiatric/Behavioral: Negative.   All other systems reviewed and are negative.   Physical Exam Updated Vital Signs BP 115/80   Pulse 85   Temp 98.4 F (36.9 C) (Oral)   Resp 20   SpO2 98%   Physical Exam Constitutional:      General: He is not in acute distress.    Appearance: He is well-developed.  HENT:     Head: Normocephalic.  Cardiovascular:     Rate and Rhythm: Normal rate.  Pulmonary:     Effort: Pulmonary effort is normal.     Breath sounds: No wheezing.  Musculoskeletal:        General: Normal range of motion.     Cervical back: Neck supple.  Skin:    Findings: Rash present.     Comments: Scattered areas of linear erythema with surrounding excoriations predominantly on arms, less prevalent on the legs, also noted on mid to lower back.  No intertriginous involvement.  No pustules.  There are several areas of very tiny vesicles in a linear pattern.  Neurological:     General: No focal deficit present.     ED Results / Procedures / Treatments   Labs (all labs ordered are listed, but only abnormal results are displayed) Labs Reviewed - No data to display  EKG None  Radiology No results found.  Procedures Procedures   Medications Ordered in ED Medications  hydrOXYzine (ATARAX/VISTARIL) tablet 50 mg (50 mg Oral Given 08/24/20 1811)    ED Course  I have reviewed the triage vital signs and the nursing notes.  Pertinent labs & imaging results that were available during my care of the patient  were reviewed by me and considered in my medical decision making (see chart for details).    MDM Rules/Calculators/A&P                          Exam consistent with contact dermatitis, probably poison ivy or poison oak.  He was encouraged to continue his Decadron Dosepak.  He was given Atarax for hopeful better improvement in itching symptoms.  Also recommended an antihistamine such as Claritin or Zyrtec which he does have at home.  Cool compresses, avoid hot showers, Goldbond anti-itch cream for particularly itchy areas..  Follow-up for any persistent or worsening symptoms.  There are no exam findings to suggest that this is scabies.  He has used the permethrin the past 2 nights.  It is possible the itching  is worse secondary to this exposure, he was advised to stop using this medication. Final Clinical Impression(s) / ED Diagnoses Final diagnoses:  Irritant contact dermatitis, unspecified trigger    Rx / DC Orders ED Discharge Orders         Ordered    hydrOXYzine (ATARAX/VISTARIL) 25 MG tablet  Every 6 hours PRN        08/24/20 1819           Victoriano Lain 08/24/20 2140    Bethann Berkshire, MD 08/25/20 2319

## 2020-08-24 NOTE — ED Triage Notes (Signed)
States he developed a rash over body after mowing grass several days ago. Went to [facility] and was treated and denies relief

## 2020-08-24 NOTE — Discharge Instructions (Addendum)
I suspect you have been exposed to either poison ivy or poison oak, a lawn mower can spread the oil from the plant through the air and give you a generalized itchy rash which I suspect is the source of your symptoms.  Complete the steroid taper that you were prescribed at the other hospital.  You may try the Atarax that we prescribed you for itch relief since the Benadryl has not helped, for some people Atarax works better.  This medicine may make you drowsy so use caution while taking it.  Cool compresses or showers can help with immediate itch relief.  I also recommend product such as generic version of Goldbond anti-itch cream on the areas that are most itchy which can help you with this symptom as well.  Avoid scratching using your fingernails which can cause abrasions in skin infection.  Stop using the permethrin cream as this may be making your itch worse.  Get rechecked if the steps do not improve your symptoms over the next several days.
